# Patient Record
Sex: Female | Born: 1980 | Race: Black or African American | Hispanic: No | Marital: Single | Smoking: Never smoker
Health system: Southern US, Community
[De-identification: ages and names within clinical notes are randomized; demographics above are authoritative.]

## PROBLEM LIST (undated history)

## (undated) DIAGNOSIS — I10 Essential (primary) hypertension: Secondary | ICD-10-CM

## (undated) DIAGNOSIS — H547 Unspecified visual loss: Secondary | ICD-10-CM

## (undated) HISTORY — DX: Essential (primary) hypertension: I10

## (undated) HISTORY — DX: Unspecified visual loss: H54.7

## (undated) HISTORY — PX: OTHER SURGICAL HISTORY: SHX169

## (undated) HISTORY — PX: WISDOM TOOTH EXTRACTION: SHX21

---

## 2012-07-18 DIAGNOSIS — H33009 Unspecified retinal detachment with retinal break, unspecified eye: Secondary | ICD-10-CM | POA: Insufficient documentation

## 2014-04-30 DIAGNOSIS — Q150 Congenital glaucoma: Secondary | ICD-10-CM | POA: Insufficient documentation

## 2015-07-08 DIAGNOSIS — H5411 Blindness, right eye, low vision left eye: Secondary | ICD-10-CM | POA: Diagnosis not present

## 2015-07-08 DIAGNOSIS — Q150 Congenital glaucoma: Secondary | ICD-10-CM | POA: Diagnosis not present

## 2015-07-08 DIAGNOSIS — H35023 Exudative retinopathy, bilateral: Secondary | ICD-10-CM | POA: Diagnosis not present

## 2015-07-08 DIAGNOSIS — H542 Low vision, both eyes: Secondary | ICD-10-CM | POA: Diagnosis not present

## 2015-07-08 DIAGNOSIS — H35053 Retinal neovascularization, unspecified, bilateral: Secondary | ICD-10-CM | POA: Diagnosis not present

## 2015-07-08 DIAGNOSIS — H35372 Puckering of macula, left eye: Secondary | ICD-10-CM | POA: Diagnosis not present

## 2015-10-06 DIAGNOSIS — Z23 Encounter for immunization: Secondary | ICD-10-CM | POA: Diagnosis not present

## 2015-11-05 DIAGNOSIS — E669 Obesity, unspecified: Secondary | ICD-10-CM | POA: Diagnosis not present

## 2015-11-05 DIAGNOSIS — Z0000 Encounter for general adult medical examination without abnormal findings: Secondary | ICD-10-CM | POA: Diagnosis not present

## 2015-11-05 DIAGNOSIS — Z6830 Body mass index (BMI) 30.0-30.9, adult: Secondary | ICD-10-CM | POA: Diagnosis not present

## 2015-11-05 DIAGNOSIS — E559 Vitamin D deficiency, unspecified: Secondary | ICD-10-CM | POA: Diagnosis not present

## 2016-04-13 DIAGNOSIS — Q150 Congenital glaucoma: Secondary | ICD-10-CM | POA: Diagnosis not present

## 2016-04-13 DIAGNOSIS — H35053 Retinal neovascularization, unspecified, bilateral: Secondary | ICD-10-CM | POA: Diagnosis not present

## 2016-04-13 DIAGNOSIS — H35023 Exudative retinopathy, bilateral: Secondary | ICD-10-CM | POA: Diagnosis not present

## 2016-04-13 DIAGNOSIS — H35372 Puckering of macula, left eye: Secondary | ICD-10-CM | POA: Diagnosis not present

## 2016-10-12 DIAGNOSIS — H35053 Retinal neovascularization, unspecified, bilateral: Secondary | ICD-10-CM | POA: Diagnosis not present

## 2016-10-12 DIAGNOSIS — H35372 Puckering of macula, left eye: Secondary | ICD-10-CM | POA: Diagnosis not present

## 2016-10-12 DIAGNOSIS — H35023 Exudative retinopathy, bilateral: Secondary | ICD-10-CM | POA: Diagnosis not present

## 2016-10-12 DIAGNOSIS — Q150 Congenital glaucoma: Secondary | ICD-10-CM | POA: Diagnosis not present

## 2016-10-13 DIAGNOSIS — Z23 Encounter for immunization: Secondary | ICD-10-CM | POA: Diagnosis not present

## 2016-11-10 DIAGNOSIS — Z0000 Encounter for general adult medical examination without abnormal findings: Secondary | ICD-10-CM | POA: Diagnosis not present

## 2016-11-10 DIAGNOSIS — K5900 Constipation, unspecified: Secondary | ICD-10-CM | POA: Diagnosis not present

## 2016-11-10 DIAGNOSIS — Z3041 Encounter for surveillance of contraceptive pills: Secondary | ICD-10-CM | POA: Diagnosis not present

## 2016-11-10 DIAGNOSIS — E559 Vitamin D deficiency, unspecified: Secondary | ICD-10-CM | POA: Diagnosis not present

## 2016-11-10 DIAGNOSIS — Z113 Encounter for screening for infections with a predominantly sexual mode of transmission: Secondary | ICD-10-CM | POA: Diagnosis not present

## 2016-11-10 DIAGNOSIS — Z124 Encounter for screening for malignant neoplasm of cervix: Secondary | ICD-10-CM | POA: Diagnosis not present

## 2016-11-10 DIAGNOSIS — R5383 Other fatigue: Secondary | ICD-10-CM | POA: Diagnosis not present

## 2017-02-02 DIAGNOSIS — R030 Elevated blood-pressure reading, without diagnosis of hypertension: Secondary | ICD-10-CM | POA: Diagnosis not present

## 2017-02-02 DIAGNOSIS — Z8249 Family history of ischemic heart disease and other diseases of the circulatory system: Secondary | ICD-10-CM | POA: Diagnosis not present

## 2017-03-14 DIAGNOSIS — R030 Elevated blood-pressure reading, without diagnosis of hypertension: Secondary | ICD-10-CM | POA: Diagnosis not present

## 2017-04-12 DIAGNOSIS — H35053 Retinal neovascularization, unspecified, bilateral: Secondary | ICD-10-CM | POA: Diagnosis not present

## 2017-04-12 DIAGNOSIS — H35372 Puckering of macula, left eye: Secondary | ICD-10-CM | POA: Diagnosis not present

## 2017-04-12 DIAGNOSIS — H35023 Exudative retinopathy, bilateral: Secondary | ICD-10-CM | POA: Diagnosis not present

## 2017-04-25 DIAGNOSIS — R030 Elevated blood-pressure reading, without diagnosis of hypertension: Secondary | ICD-10-CM | POA: Diagnosis not present

## 2017-10-11 DIAGNOSIS — H35372 Puckering of macula, left eye: Secondary | ICD-10-CM | POA: Diagnosis not present

## 2017-10-11 DIAGNOSIS — H35053 Retinal neovascularization, unspecified, bilateral: Secondary | ICD-10-CM | POA: Diagnosis not present

## 2017-10-11 DIAGNOSIS — H35023 Exudative retinopathy, bilateral: Secondary | ICD-10-CM | POA: Diagnosis not present

## 2017-11-08 DIAGNOSIS — Z23 Encounter for immunization: Secondary | ICD-10-CM | POA: Diagnosis not present

## 2017-11-18 ENCOUNTER — Ambulatory Visit (INDEPENDENT_AMBULATORY_CARE_PROVIDER_SITE_OTHER): Payer: Medicare Other | Admitting: Nurse Practitioner

## 2017-11-18 ENCOUNTER — Encounter: Payer: Self-pay | Admitting: Nurse Practitioner

## 2017-11-18 VITALS — BP 120/84 | HR 87 | Temp 98.1°F | Ht 62.0 in | Wt 165.0 lb

## 2017-11-18 DIAGNOSIS — N6312 Unspecified lump in the right breast, upper inner quadrant: Secondary | ICD-10-CM | POA: Diagnosis not present

## 2017-11-18 DIAGNOSIS — Z0000 Encounter for general adult medical examination without abnormal findings: Secondary | ICD-10-CM | POA: Diagnosis not present

## 2017-11-18 DIAGNOSIS — I10 Essential (primary) hypertension: Secondary | ICD-10-CM

## 2017-11-18 DIAGNOSIS — Z3041 Encounter for surveillance of contraceptive pills: Secondary | ICD-10-CM | POA: Diagnosis not present

## 2017-11-18 DIAGNOSIS — Z0001 Encounter for general adult medical examination with abnormal findings: Secondary | ICD-10-CM | POA: Diagnosis not present

## 2017-11-18 LAB — POCT URINALYSIS DIPSTICK
BILIRUBIN UA: NEGATIVE
Glucose, UA: NEGATIVE
Leukocytes, UA: NEGATIVE
NITRITE UA: NEGATIVE
PH UA: 6.5 (ref 5.0–8.0)
PROTEIN UA: NEGATIVE
SPEC GRAV UA: 1.025 (ref 1.010–1.025)
UROBILINOGEN UA: 1 U/dL

## 2017-11-18 LAB — POCT UA - MICROALBUMIN
Albumin/Creatinine Ratio, Urine, POC: 30
Creatinine, POC: 300 mg/dL
Microalbumin Ur, POC: 30 mg/L

## 2017-11-18 NOTE — Progress Notes (Signed)
Subjective:     Patient ID: Yesenia Velasquez , female    DOB: 1980-05-06 , 37 y.o.   MRN: [id number]   Chief Complaint  Patient presents with  . Annual Exam    HPI  Hypertension  This is a chronic problem. The current episode started more than 1 year ago. The problem has been gradually improving since onset. The problem is controlled. Pertinent negatives include no anxiety. There are no associated agents to hypertension. Past treatments include ACE inhibitors. The current treatment provides moderate improvement. There are no compliance problems.  There is no history of CAD/MI or CVA. There is no history of chronic renal disease.      The patient states she uses OCP (estrogen/progesterone) for birth control. Last LMP 11/17/17 was No LMP recorded.. Negative for Dysmenorrhea and Negative for Menorrhagia. Negative for: breast discharge, breast lump(s), breast pain and breast self exam. Associated symptoms include abnormal vaginal bleeding. Pertinent negatives include abnormal bleeding (hematology), anxiety, decreased libido, depression, difficulty falling sleep, dyspareunia, history of infertility, nocturia, sexual dysfunction, sleep disturbances, urinary incontinence, urinary urgency, vaginal discharge and vaginal itching. Diet regular, tries to watch salt intake.The patient states her exercise level is  occasionally    . The patient's tobacco use is:  Social History   Tobacco Use  Smoking Status Never Smoker  Smokeless Tobacco Never Used  . She has been exposed to passive smoke. The patient's alcohol use is:  Social History   Substance and Sexual Activity  Alcohol Use Yes   Comment: SOCIALLY  . Additional information: Last pap 10/2016, next one scheduled for 10/ 2021.   No past medical history on file.    Current Outpatient Medications:  .  hydrochlorothiazide (HYDRODIURIL) 25 MG tablet, Take 25 mg by mouth daily., Disp: , Rfl:  .  norethindrone-ethinyl estradiol-iron (MICROGESTIN  FE,GILDESS FE,LOESTRIN FE) 1.5-30 MG-MCG tablet, Take 1 tablet by mouth daily., Disp: , Rfl:    No Known Allergies   Review of Systems  Constitutional: Negative.   HENT: Negative.   Eyes: Negative.        Legally blind   Respiratory: Negative.   Cardiovascular: Negative.   Gastrointestinal: Negative.   Endocrine: Negative.   Genitourinary: Negative.   Musculoskeletal: Negative.   Skin: Negative.   Allergic/Immunologic: Negative.   Neurological: Negative.   Hematological: Negative.   Psychiatric/Behavioral: Negative.      Today's Vitals   11/18/17 1452  BP: 120/84  Pulse: 87  Temp: 98.1 F (36.7 C)  TempSrc: Oral  SpO2: 98%  Weight: 165 lb (74.8 kg)  Height: 5\' 2"  (1.575 m)  PainSc: 8   PainLoc: Back   Body mass index is 30.18 kg/m.   Objective:  Physical Exam  Constitutional: She is oriented to person, place, and time. She appears well-developed and well-nourished.  HENT:  Head: Normocephalic.  Right Ear: External ear normal.  Left Ear: External ear normal.  Nose: Nose normal.  Mouth/Throat: Oropharynx is clear and moist.  Bilateral cerumen excess, not impacted  Eyes: Pupils are equal, round, and reactive to light. Conjunctivae and EOM are normal.  Neck: Normal range of motion. Neck supple.  Cardiovascular: Normal rate, regular rhythm and normal heart sounds.  Pulmonary/Chest: Effort normal and breath sounds normal. Right breast exhibits mass. There is breast discharge. No breast tenderness. Breasts are symmetrical.  Palpable lump to right upper inner breast at 2 o'clock.    Abdominal: Soft. Bowel sounds are normal.  Musculoskeletal: Normal range of motion.  Neurological: She  is alert and oriented to person, place, and time.  Skin: Skin is warm and dry. Capillary refill takes less than 2 seconds.  Psychiatric: She has a normal mood and affect.        Assessment And Plan:     1. Health maintenance examination  Behavior modifications discussed and diet  history reviewed.    Pt will continue to exercise regularly and modify diet with low GI, plant based foods and decrease intake of processed foods.   Recommend intake of daily multivitamin, Vitamin D.   All immunizations that include influenza, TDAP are up to date - CBC - CMP14 + Anion Gap  2. Essential hypertension  Chronic, controlled  Continue with current medications - POCT Urinalysis Dipstick (81002) - POCT UA - Microalbumin - EKG 12-Lead - CBC - CMP14 + Anion Gap  3. Lump in upper inner quadrant of right breast  Palpable nodule to left upper inner breast at 2 o'clock.  Will obtain diagnostic mammogram  Negative pregnancy test in office - MM Digital Diagnostic Unilat R; Future         Minette Brine, FNP

## 2017-11-19 LAB — CMP14 + ANION GAP
ALK PHOS: 61 IU/L (ref 39–117)
ALT: 17 IU/L (ref 0–32)
ANION GAP: 18 mmol/L (ref 10.0–18.0)
AST: 18 IU/L (ref 0–40)
Albumin/Globulin Ratio: 1.4 (ref 1.2–2.2)
Albumin: 4.3 g/dL (ref 3.5–5.5)
BUN/Creatinine Ratio: 14 (ref 9–23)
BUN: 13 mg/dL (ref 6–20)
Bilirubin Total: 0.2 mg/dL (ref 0.0–1.2)
CO2: 26 mmol/L (ref 20–29)
CREATININE: 0.95 mg/dL (ref 0.57–1.00)
Calcium: 9.6 mg/dL (ref 8.7–10.2)
Chloride: 98 mmol/L (ref 96–106)
GFR calc Af Amer: 88 mL/min/{1.73_m2} (ref >59)
GFR calc non Af Amer: 77 mL/min/{1.73_m2} (ref >59)
GLUCOSE: 87 mg/dL (ref 65–99)
Globulin, Total: 3 g/dL (ref 1.5–4.5)
Potassium: 3.5 mmol/L (ref 3.5–5.2)
Sodium: 142 mmol/L (ref 134–144)
Total Protein: 7.3 g/dL (ref 6.0–8.5)

## 2017-11-19 LAB — CBC
Hematocrit: 40 % (ref 34.0–46.6)
Hemoglobin: 13.7 g/dL (ref 11.1–15.9)
MCH: 29.7 pg (ref 26.6–33.0)
MCHC: 34.3 g/dL (ref 31.5–35.7)
MCV: 87 fL (ref 79–97)
PLATELETS: 267 10*3/uL (ref 150–450)
RBC: 4.61 x10E6/uL (ref 3.77–5.28)
RDW: 12.9 % (ref 12.3–15.4)
WBC: 6.8 10*3/uL (ref 3.4–10.8)

## 2017-11-29 ENCOUNTER — Other Ambulatory Visit: Payer: Self-pay | Admitting: Nurse Practitioner

## 2017-11-29 DIAGNOSIS — N6312 Unspecified lump in the right breast, upper inner quadrant: Secondary | ICD-10-CM

## 2017-12-08 ENCOUNTER — Ambulatory Visit
Admission: RE | Admit: 2017-12-08 | Discharge: 2017-12-08 | Disposition: A | Discharge status: Home / Self Care | Payer: Medicare Other | Source: Ambulatory Visit | Attending: Nurse Practitioner | Admitting: Nurse Practitioner

## 2017-12-08 DIAGNOSIS — R922 Inconclusive mammogram: Secondary | ICD-10-CM | POA: Diagnosis not present

## 2017-12-08 DIAGNOSIS — N6312 Unspecified lump in the right breast, upper inner quadrant: Secondary | ICD-10-CM

## 2017-12-08 DIAGNOSIS — N6489 Other specified disorders of breast: Secondary | ICD-10-CM | POA: Diagnosis not present

## 2018-03-26 ENCOUNTER — Other Ambulatory Visit: Payer: Self-pay | Admitting: Nurse Practitioner

## 2018-04-06 ENCOUNTER — Other Ambulatory Visit: Payer: Self-pay

## 2018-04-06 ENCOUNTER — Ambulatory Visit (INDEPENDENT_AMBULATORY_CARE_PROVIDER_SITE_OTHER): Payer: Medicare Other

## 2018-04-06 ENCOUNTER — Ambulatory Visit (INDEPENDENT_AMBULATORY_CARE_PROVIDER_SITE_OTHER): Payer: Medicare Other | Admitting: Nurse Practitioner

## 2018-04-06 ENCOUNTER — Encounter: Payer: Self-pay | Admitting: Nurse Practitioner

## 2018-04-06 VITALS — BP 124/82 | HR 73 | Temp 98.3°F | Ht 62.0 in | Wt 164.0 lb

## 2018-04-06 VITALS — BP 124/82 | HR 73 | Ht 62.0 in | Wt 164.0 lb

## 2018-04-06 DIAGNOSIS — I10 Essential (primary) hypertension: Secondary | ICD-10-CM

## 2018-04-06 DIAGNOSIS — H548 Legal blindness, as defined in USA: Secondary | ICD-10-CM

## 2018-04-06 DIAGNOSIS — Z0000 Encounter for general adult medical examination without abnormal findings: Secondary | ICD-10-CM

## 2018-04-06 NOTE — Patient Instructions (Signed)
Ms. Yesenia Velasquez , Thank you for taking time to come for your Medicare Wellness Visit. I appreciate your ongoing commitment to your health goals. Please review the following plan we discussed and let me know if I can assist you in the future.   Screening recommendations/referrals: Colonoscopy: n/a Mammogram: 11/2017 Bone Density: n/a Recommended yearly ophthalmology/optometry visit for glaucoma screening and checkup Recommended yearly dental visit for hygiene and checkup  Vaccinations: Influenza vaccine: 10/2017 Pneumococcal vaccine: n/a Tdap vaccine: 03/2013 Shingles vaccine: n/a    Advanced directives: Advance directive discussed with you today. I have provided a copy for you to complete at home and have notarized. Once this is complete please bring a copy in to our office so we can scan it into your chart.   Conditions/risks identified: overweight  Next appointment: 11/24/2018 at 230  Preventive Care 40-64 Years, Female Preventive care refers to lifestyle choices and visits with your health care provider that can promote health and wellness. What does preventive care include?  A yearly physical exam. This is also called an annual well check.  Dental exams once or twice a year.  Routine eye exams. Ask your health care provider how often you should have your eyes checked.  Personal lifestyle choices, including:  Daily care of your teeth and gums.  Regular physical activity.  Eating a healthy diet.  Avoiding tobacco and drug use.  Limiting alcohol use.  Practicing safe sex.  Taking low-dose aspirin daily starting at age 80.  Taking vitamin and mineral supplements as recommended by your health care provider. What happens during an annual well check? The services and screenings done by your health care provider during your annual well check will depend on your age, overall health, lifestyle risk factors, and family history of disease. Counseling  Your health care provider  may ask you questions about your:  Alcohol use.  Tobacco use.  Drug use.  Emotional well-being.  Home and relationship well-being.  Sexual activity.  Eating habits.  Work and work Statistician.  Method of birth control.  Menstrual cycle.  Pregnancy history. Screening  You may have the following tests or measurements:  Height, weight, and BMI.  Blood pressure.  Lipid and cholesterol levels. These may be checked every 5 years, or more frequently if you are over 44 years old.  Skin check.  Lung cancer screening. You may have this screening every year starting at age 45 if you have a 30-pack-year history of smoking and currently smoke or have quit within the past 15 years.  Fecal occult blood test (FOBT) of the stool. You may have this test every year starting at age 8.  Flexible sigmoidoscopy or colonoscopy. You may have a sigmoidoscopy every 5 years or a colonoscopy every 10 years starting at age 11.  Hepatitis C blood test.  Hepatitis B blood test.  Sexually transmitted disease (STD) testing.  Diabetes screening. This is done by checking your blood sugar (glucose) after you have not eaten for a while (fasting). You may have this done every 1-3 years.  Mammogram. This may be done every 1-2 years. Talk to your health care provider about when you should start having regular mammograms. This may depend on whether you have a family history of breast cancer.  BRCA-related cancer screening. This may be done if you have a family history of breast, ovarian, tubal, or peritoneal cancers.  Pelvic exam and Pap test. This may be done every 3 years starting at age 30. Starting at age 35, this  may be done every 5 years if you have a Pap test in combination with an HPV test.  Bone density scan. This is done to screen for osteoporosis. You may have this scan if you are at high risk for osteoporosis. Discuss your test results, treatment options, and if necessary, the need for more  tests with your health care provider. Vaccines  Your health care provider may recommend certain vaccines, such as:  Influenza vaccine. This is recommended every year.  Tetanus, diphtheria, and acellular pertussis (Tdap, Td) vaccine. You may need a Td booster every 10 years.  Zoster vaccine. You may need this after age 64.  Pneumococcal 13-valent conjugate (PCV13) vaccine. You may need this if you have certain conditions and were not previously vaccinated.  Pneumococcal polysaccharide (PPSV23) vaccine. You may need one or two doses if you smoke cigarettes or if you have certain conditions. Talk to your health care provider about which screenings and vaccines you need and how often you need them. This information is not intended to replace advice given to you by your health care provider. Make sure you discuss any questions you have with your health care provider. Document Released: 02/07/2015 Document Revised: 10/01/2015 Document Reviewed: 11/12/2014 Elsevier Interactive Patient Education  2017 [place] Prevention in the Home Falls can cause injuries. They can happen to people of all ages. There are many things you can do to make your home safe and to help prevent falls. What can I do on the outside of my home?  Regularly fix the edges of walkways and driveways and fix any cracks.  Remove anything that might make you trip as you walk through a door, such as a raised step or threshold.  Trim any bushes or trees on the path to your home.  Use bright outdoor lighting.  Clear any walking paths of anything that might make someone trip, such as rocks or tools.  Regularly check to see if handrails are loose or broken. Make sure that both sides of any steps have handrails.  Any raised decks and porches should have guardrails on the edges.  Have any leaves, snow, or ice cleared regularly.  Use sand or salt on walking paths during winter.  Clean up any spills in your  garage right away. This includes oil or grease spills. What can I do in the bathroom?  Use night lights.  Install grab bars by the toilet and in the tub and shower. Do not use towel bars as grab bars.  Use non-skid mats or decals in the tub or shower.  If you need to sit down in the shower, use a plastic, non-slip stool.  Keep the floor dry. Clean up any water that spills on the floor as soon as it happens.  Remove soap buildup in the tub or shower regularly.  Attach bath mats securely with double-sided non-slip rug tape.  Do not have throw rugs and other things on the floor that can make you trip. What can I do in the bedroom?  Use night lights.  Make sure that you have a light by your bed that is easy to reach.  Do not use any sheets or blankets that are too big for your bed. They should not hang down onto the floor.  Have a firm chair that has side arms. You can use this for support while you get dressed.  Do not have throw rugs and other things on the floor that can make you  trip. What can I do in the kitchen?  Clean up any spills right away.  Avoid walking on wet floors.  Keep items that you use a lot in easy-to-reach places.  If you need to reach something above you, use a strong step stool that has a grab bar.  Keep electrical cords out of the way.  Do not use floor polish or wax that makes floors slippery. If you must use wax, use non-skid floor wax.  Do not have throw rugs and other things on the floor that can make you trip. What can I do with my stairs?  Do not leave any items on the stairs.  Make sure that there are handrails on both sides of the stairs and use them. Fix handrails that are broken or loose. Make sure that handrails are as long as the stairways.  Check any carpeting to make sure that it is firmly attached to the stairs. Fix any carpet that is loose or worn.  Avoid having throw rugs at the top or bottom of the stairs. If you do have throw  rugs, attach them to the floor with carpet tape.  Make sure that you have a light switch at the top of the stairs and the bottom of the stairs. If you do not have them, ask someone to add them for you. What else can I do to help prevent falls?  Wear shoes that:  Do not have high heels.  Have rubber bottoms.  Are comfortable and fit you well.  Are closed at the toe. Do not wear sandals.  If you use a stepladder:  Make sure that it is fully opened. Do not climb a closed stepladder.  Make sure that both sides of the stepladder are locked into place.  Ask someone to hold it for you, if possible.  Clearly mark and make sure that you can see:  Any grab bars or handrails.  First and last steps.  Where the edge of each step is.  Use tools that help you move around (mobility aids) if they are needed. These include:  Canes.  Walkers.  Scooters.  Crutches.  Turn on the lights when you go into a dark area. Replace any light bulbs as soon as they burn out.  Set up your furniture so you have a clear path. Avoid moving your furniture around.  If any of your floors are uneven, fix them.  If there are any pets around you, be aware of where they are.  Review your medicines with your doctor. Some medicines can make you feel dizzy. This can increase your chance of falling. Ask your doctor what other things that you can do to help prevent falls. This information is not intended to replace advice given to you by your health care provider. Make sure you discuss any questions you have with your health care provider. Document Released: 11/07/2008 Document Revised: 06/19/2015 Document Reviewed: 02/15/2014 Elsevier Interactive Patient Education  2017 [employer].

## 2018-04-06 NOTE — Progress Notes (Signed)
Subjective:   Yesenia Velasquez is a 38 y.o. female who presents for an Initial Medicare Annual Wellness Visit.  Review of Systems    n/a  Cardiac Risk Factors include: hypertension     Objective:    Today's Vitals   04/06/18 1537  BP: 124/82  Pulse: 73  Temp: 98.3 F (36.8 C)  TempSrc: Oral  SpO2: 98%  Weight: 164 lb (74.4 kg)  Height: 5\' 2"  (1.575 m)   Body mass index is 30 kg/m.  Advanced Directives 04/06/2018  Does Patient Have a Medical Advance Directive? No  Would patient like information on creating a medical advance directive? Yes (MAU/Ambulatory/Procedural Areas - Information given)    Current Medications (verified) Outpatient Encounter Medications as of 04/06/2018  Medication Sig  . hydrochlorothiazide (HYDRODIURIL) 25 MG tablet TAKE 1 TABLET BY MOUTH EVERY DAY  . norethindrone-ethinyl estradiol-iron (MICROGESTIN FE,GILDESS FE,LOESTRIN FE) 1.5-30 MG-MCG tablet Take 1 tablet by mouth daily.   No facility-administered encounter medications on file as of 04/06/2018.     Allergies (verified) Patient has no known allergies.   History: Past Medical History:  Diagnosis Date  . Hypertension    History reviewed. No pertinent surgical history. History reviewed. No pertinent family history. Social History   Socioeconomic History  . Marital status: Single    Spouse name: Not on file  . Number of children: Not on file  . Years of education: Not on file  . Highest education level: Not on file  Occupational History  . Not on file  Social Needs  . Financial resource strain: Not hard at all  . Food insecurity:    Worry: Never true    Inability: Never true  . Transportation needs:    Medical: No    Non-medical: No  Tobacco Use  . Smoking status: Never Smoker  . Smokeless tobacco: Never Used  Substance and Sexual Activity  . Alcohol use: Yes    Comment: SOCIALLY  . Drug use: Not Currently  . Sexual activity: Yes  Lifestyle  . Physical activity:   Days per week: 7 days    Minutes per session: 30 min  . Stress: Not at all  Relationships  . Social connections:    Talks on phone: Not on file    Gets together: Not on file    Attends religious service: Not on file    Active member of club or organization: Not on file    Attends meetings of clubs or organizations: Not on file    Relationship status: Not on file  Other Topics Concern  . Not on file  Social History Narrative  . Not on file    Tobacco Counseling Counseling given: Not Answered   Clinical Intake:  Pre-visit preparation completed: Yes  Pain : No/denies pain     Nutritional Status: BMI > 30  Obese Nutritional Risks: None Diabetes: No  How often do you need to have someone help you when you read instructions, pamphlets, or other written materials from your doctor or pharmacy?: 1 - Never What is the last grade level you completed in school?: 12TH GRADE     Information entered by :: NAllen LPN   Activities of Daily Living In your present state of health, do you have any difficulty performing the following activities: 04/06/2018  Hearing? N  Vision? Y  Comment blurry vision  Difficulty concentrating or making decisions? N  Walking or climbing stairs? N  Dressing or bathing? N  Doing errands, shopping? N  Preparing Food and eating ? N  Using the Toilet? N  In the past six months, have you accidently leaked urine? N  Do you have problems with loss of bowel control? N  Managing your Medications? N  Managing your Finances? N  Housekeeping or managing your Housekeeping? N     Immunizations and Health Maintenance Immunization History  Administered Date(s) Administered  . Influenza-Unspecified 11/07/2017   There are no preventive care reminders to display for this patient.  Patient Care Team: Minette Brine, FNP as PCP - General (General Practice)  Indicate any recent Medical Services you may have received from other than [facility] providers in the past  year (date may be approximate).     Assessment:   This is a routine wellness examination for Yesenia Velasquez.  Hearing/Vision screen Vision Screening Comments: [facility] Annual eye exams  Dietary issues and exercise activities discussed: Current Exercise Habits: The patient has a physically strenuous job, but has no regular exercise apart from work., Type of exercise: walking, Time (Minutes): 30, Frequency (Times/Week): 7, Weekly Exercise (Minutes/Week): 210, Intensity: Moderate  Goals    . Exercise 150 min/wk Moderate Activity (pt-stated)     Wants to exercise more to lose stomach      Depression Screen PHQ 2/9 Scores 04/06/2018 11/18/2017  PHQ - 2 Score 0 0  PHQ- 9 Score 0 -    Fall Risk Fall Risk  04/06/2018 11/18/2017  Falls in the past year? 0 No  Risk for fall due to : Medication side effect -  Follow up Education provided;Falls prevention discussed -    Is the patient's home free of loose throw rugs in walkways, pet beds, electrical cords, etc?   yes      Grab bars in the bathroom? no      Handrails on the stairs?   yes      Adequate lighting?   yes  Timed Get Up and Go Performed n/a  Cognitive Function:     6CIT Screen 04/06/2018  What Year? 0 points  What month? 0 points  What time? 0 points  Count back from 20 0 points  Months in reverse 0 points  Repeat phrase 0 points  Total Score 0    Screening Tests Health Maintenance  Topic Date Due  . PAP SMEAR-Modifier  11/14/2019  . TETANUS/TDAP  11/14/2023  . INFLUENZA VACCINE  Completed  . HIV Screening  Completed    Qualifies for Shingles Vaccine? no  Cancer Screenings: Lung: Low Dose CT Chest recommended if Age 61-80 years, 30 pack-year currently smoking OR have quit w/in 15years. Patient does not qualify. Breast: Up to date on Mammogram? Yes   Up to date of Bone Density/Dexa? n/a Colorectal: n/a  Additional Screenings:  Hepatitis C Screening: n/a     Plan:    Wants to exercise more to lose  stomach.  I have personally reviewed and noted the following in the patient's chart:   . Medical and social history . Use of alcohol, tobacco or illicit drugs  . Current medications and supplements . Functional ability and status . Nutritional status . Physical activity . Advanced directives . List of other physicians . Hospitalizations, surgeries, and ER visits in previous 12 months . Vitals . Screenings to include cognitive, depression, and falls . Referrals and appointments  In addition, I have reviewed and discussed with patient certain preventive protocols, quality metrics, and best practice recommendations. A written personalized care plan for preventive services as well as general  preventive health recommendations were provided to patient.     Kellie Simmering, LPN   [id number]

## 2018-04-06 NOTE — Progress Notes (Signed)
  Subjective:     Patient ID: Yesenia Velasquez , female    DOB: [birth year] , 38 y.o.   MRN: [id number]   Chief Complaint  Patient presents with  . Hypertension    follow up for htn     HPI  Hypertension  This is a new problem. The current episode started more than 1 year ago. The problem is unchanged. The problem is controlled. Pertinent negatives include no anxiety, chest pain, headaches or palpitations. There are no associated agents to hypertension. There are no known risk factors for coronary artery disease.     Past Medical History:  Diagnosis Date  . Hypertension      No family history on file.   Current Outpatient Medications:  .  hydrochlorothiazide (HYDRODIURIL) 25 MG tablet, TAKE 1 TABLET BY MOUTH EVERY DAY, Disp: 90 tablet, Rfl: 0 .  norethindrone-ethinyl estradiol-iron (MICROGESTIN FE,GILDESS FE,LOESTRIN FE) 1.5-30 MG-MCG tablet, Take 1 tablet by mouth daily., Disp: , Rfl:    No Known Allergies   Review of Systems  Constitutional: Negative for fatigue.  HENT: Negative for congestion.        Legally blind  Respiratory: Negative for cough.   Cardiovascular: Negative for chest pain, palpitations and leg swelling.  Gastrointestinal: Negative for diarrhea and nausea.  Endocrine: Negative for polydipsia, polyphagia and polyuria.  Neurological: Negative for dizziness and headaches.     Today's Vitals   04/06/18 1521  BP: 124/82  Pulse: 73  SpO2: 98%  Weight: 164 lb (74.4 kg)  Height: 5\' 2"  (1.575 m)   Body mass index is 30 kg/m.   Objective:  Physical Exam Vitals signs reviewed.  Constitutional:      Appearance: Normal appearance.  Cardiovascular:     Rate and Rhythm: Normal rate and regular rhythm.     Pulses: Normal pulses.     Heart sounds: Normal heart sounds. No murmur.  Pulmonary:     Effort: Pulmonary effort is normal. No respiratory distress.     Breath sounds: Normal breath sounds. No wheezing or rales.  Skin:    General: Skin is warm and  dry.     Capillary Refill: Capillary refill takes less than 2 seconds.  Neurological:     General: No focal deficit present.     Mental Status: She is alert and oriented to person, place, and time.  Psychiatric:        Mood and Affect: Mood normal.        Behavior: Behavior normal.        Thought Content: Thought content normal.        Judgment: Judgment normal.         Assessment And Plan:     1. Essential hypertension . B/P is controlled.  . The importance of regular exercise and dietary modification was stressed to the patient.    Minette Brine, FNP

## 2018-04-09 ENCOUNTER — Other Ambulatory Visit: Payer: Self-pay | Admitting: Nurse Practitioner

## 2018-04-18 ENCOUNTER — Other Ambulatory Visit: Payer: Self-pay | Admitting: Nurse Practitioner

## 2018-06-09 ENCOUNTER — Telehealth: Payer: Self-pay

## 2018-06-09 NOTE — Telephone Encounter (Signed)
Patient called stating she thinks she has a UTI and would like an appt. Patient stated she wanted one for the week of memorial day pt declined coming in sooner so I scheduled her for 05/26. YRL,RMA

## 2018-06-20 ENCOUNTER — Other Ambulatory Visit: Payer: Self-pay

## 2018-06-20 ENCOUNTER — Other Ambulatory Visit (HOSPITAL_COMMUNITY)
Admission: RE | Admit: 2018-06-20 | Discharge: 2018-06-20 | Disposition: A | Discharge status: Home / Self Care | Payer: BLUE CROSS/BLUE SHIELD | Source: Ambulatory Visit | Attending: Nurse Practitioner | Admitting: Nurse Practitioner

## 2018-06-20 ENCOUNTER — Ambulatory Visit (INDEPENDENT_AMBULATORY_CARE_PROVIDER_SITE_OTHER): Payer: Medicare Other | Admitting: Nurse Practitioner

## 2018-06-20 ENCOUNTER — Encounter: Payer: Self-pay | Admitting: Nurse Practitioner

## 2018-06-20 VITALS — BP 106/70 | HR 90 | Temp 98.5°F | Ht 62.6 in | Wt 162.8 lb

## 2018-06-20 DIAGNOSIS — N898 Other specified noninflammatory disorders of vagina: Secondary | ICD-10-CM

## 2018-06-20 LAB — POCT URINALYSIS DIPSTICK
Bilirubin, UA: NEGATIVE
Glucose, UA: NEGATIVE
Ketones, UA: NEGATIVE
Leukocytes, UA: NEGATIVE
Nitrite, UA: NEGATIVE
Protein, UA: NEGATIVE
Spec Grav, UA: >=1.03 — AB (ref 1.010–1.025)
Urobilinogen, UA: 1 E.U./dL
pH, UA: 6 (ref 5.0–8.0)

## 2018-06-20 MED ORDER — METRONIDAZOLE 500 MG PO TABS: 500.0000 mg | ORAL_TABLET | Freq: Two times a day (BID) | ORAL | 0 refills | Status: AC

## 2018-06-20 NOTE — Progress Notes (Signed)
  Subjective:     Patient ID: Yesenia Velasquez , female    DOB: 08/28/1980 , 38 y.o.   MRN: [id number]   Chief Complaint  Patient presents with  . VAGINAL ODOR    patient states her urine has a strange smell and she denies having a urge to go and denies having any pain    HPI  Vaginal odor for the last month.  Denies vaginal discharge.  Denies frequency in urination.   No changes in soaps or detergents.  Patient's last menstrual period was 06/16/2018.  She has been sexually active in the last 2 months.    Past Medical History:  Diagnosis Date  . Hypertension      No family history on file.   Current Outpatient Medications:  .  hydrochlorothiazide (HYDRODIURIL) 25 MG tablet, TAKE 1 TABLET BY MOUTH EVERY DAY, Disp: 90 tablet, Rfl: 0 .  norethindrone-ethinyl estradiol-iron (MICROGESTIN FE,GILDESS FE,LOESTRIN FE) 1.5-30 MG-MCG tablet, Take 1 tablet by mouth daily., Disp: , Rfl:    No Known Allergies   Review of Systems  Constitutional: Negative.   Respiratory: Negative.   Cardiovascular: Negative.  Negative for chest pain, palpitations and leg swelling.  Endocrine: Negative for polydipsia, polyphagia and polyuria.  Genitourinary: Negative for difficulty urinating, dysuria, flank pain, frequency, hematuria, pelvic pain, urgency, vaginal discharge and vaginal pain.     Today's Vitals   06/20/18 0942  BP: 106/70  Pulse: 90  Temp: 98.5 F (36.9 C)  TempSrc: Oral  Weight: 162 lb 12.8 oz (73.8 kg)  Height: 5' 2.6" (1.59 m)  PainSc: 0-No pain   Body mass index is 29.21 kg/m.   Objective:  Physical Exam Constitutional:      Appearance: Normal appearance.  Cardiovascular:     Rate and Rhythm: Normal rate and regular rhythm.     Pulses: Normal pulses.     Heart sounds: Normal heart sounds. No murmur.  Pulmonary:     Effort: Pulmonary effort is normal.     Breath sounds: Normal breath sounds.  Abdominal:     General: Bowel sounds are normal. There is no distension.   Palpations: Abdomen is soft.     Tenderness: There is no abdominal tenderness.  Skin:    General: Skin is warm and dry.     Capillary Refill: Capillary refill takes less than 2 seconds.  Neurological:     General: No focal deficit present.     Mental Status: She is alert and oriented to person, place, and time.  Psychiatric:        Mood and Affect: Mood normal.        Behavior: Behavior normal.        Thought Content: Thought content normal.        Judgment: Judgment normal.         Assessment And Plan:     1. Vaginal odor  Urinalysis has trace blood, she just ended her menstruation.  Will send Rx for metronidazole while awaiting cytology results.   - POCT Urinalysis Dipstick (81002)        Minette Brine, FNP    THE PATIENT IS ENCOURAGED TO PRACTICE SOCIAL DISTANCING DUE TO THE COVID-19 PANDEMIC.

## 2018-06-22 LAB — URINE CYTOLOGY ANCILLARY ONLY
Bacterial vaginitis: NEGATIVE
Candida vaginitis: NEGATIVE
Chlamydia: NEGATIVE
Neisseria Gonorrhea: NEGATIVE
Trichomonas: NEGATIVE

## 2018-07-01 DIAGNOSIS — Z03818 Encounter for observation for suspected exposure to other biological agents ruled out: Secondary | ICD-10-CM | POA: Diagnosis not present

## 2018-08-25 ENCOUNTER — Other Ambulatory Visit: Payer: Self-pay

## 2018-08-25 ENCOUNTER — Encounter: Payer: Self-pay | Admitting: Nurse Practitioner

## 2018-08-25 MED ORDER — HYDROCHLOROTHIAZIDE 25 MG PO TABS: 25.0000 mg | ORAL_TABLET | Freq: Every day | ORAL | 0 refills | Status: DC

## 2018-09-04 ENCOUNTER — Telehealth: Payer: Self-pay

## 2018-09-04 NOTE — Telephone Encounter (Signed)
Yesenia Velasquez from [facility] called stating pt has a regular heartbeat but she is asymptomatic she denies any chest pain or SOB. Pt bp was 120/86 and pulse was 68. Pt was advised to f/u with pcp. [phone]   I HAVE CALLED PT AND SCHEDULED HER AN APPOINTMENT SO WE CAN DO AN EKG. PT ALSO ADVISED TO TAKE ASPIRIN 81MG  DAILY. YRL,RMA

## 2018-09-11 ENCOUNTER — Ambulatory Visit (INDEPENDENT_AMBULATORY_CARE_PROVIDER_SITE_OTHER): Payer: Medicare Other | Admitting: Nurse Practitioner

## 2018-09-11 ENCOUNTER — Other Ambulatory Visit: Payer: Self-pay

## 2018-09-11 ENCOUNTER — Encounter: Payer: Self-pay | Admitting: Nurse Practitioner

## 2018-09-11 VITALS — BP 114/80 | HR 78 | Temp 98.4°F | Ht 62.2 in | Wt 163.2 lb

## 2018-09-11 DIAGNOSIS — I10 Essential (primary) hypertension: Secondary | ICD-10-CM | POA: Diagnosis not present

## 2018-09-11 DIAGNOSIS — I499 Cardiac arrhythmia, unspecified: Secondary | ICD-10-CM

## 2018-09-11 NOTE — Progress Notes (Addendum)
  Subjective:     Patient ID: Yesenia Velasquez , female    DOB: 1980-06-18 , 38 y.o.   MRN: [id number]   Chief Complaint  Patient presents with  . irregular heartbeat    HPI  She was seen by the [facility] NP who detected an abnormal heart beat. Denies any shortness of breath or chest pain. She is taking aspirin 81 mg daily. Her mother's father and siblings has a history of heart disease.   Wt Readings from Last 3 Encounters: 09/11/18 : 163 lb 3.2 oz (74 kg) 06/20/18 : 162 lb 12.8 oz (73.8 kg) 04/06/18 : 164 lb (74.4 kg)     Past Medical History:  Diagnosis Date  . Hypertension      No family history on file.   Current Outpatient Medications:  .  hydrochlorothiazide (HYDRODIURIL) 25 MG tablet, Take 1 tablet (25 mg total) by mouth daily., Disp: 90 tablet, Rfl: 0 .  norethindrone-ethinyl estradiol-iron (MICROGESTIN FE,GILDESS FE,LOESTRIN FE) 1.5-30 MG-MCG tablet, Take 1 tablet by mouth daily., Disp: , Rfl:    No Known Allergies   Review of Systems  Constitutional: Negative.   Respiratory: Negative.   Cardiovascular: Negative.  Negative for chest pain, palpitations and leg swelling.  Neurological: Negative for dizziness.  Psychiatric/Behavioral: Negative.      Today's Vitals   09/11/18 1503  BP: 114/80  Pulse: 78  Temp: 98.4 F (36.9 C)  TempSrc: Oral  Weight: 163 lb 3.2 oz (74 kg)  Height: 5' 2.2" (1.58 m)  PainSc: 0-No pain   Body mass index is 29.66 kg/m.   Objective:  Physical Exam Constitutional:      Appearance: Normal appearance.  Cardiovascular:     Rate and Rhythm: Normal rate and regular rhythm.     Pulses: Normal pulses.     Heart sounds: Normal heart sounds. No murmur.  Pulmonary:     Effort: Pulmonary effort is normal.  Skin:    General: Skin is warm and dry.     Capillary Refill: Capillary refill takes less than 2 seconds.  Neurological:     General: No focal deficit present.     Mental Status: She is alert and oriented to person, place, and  time.  Psychiatric:        Mood and Affect: Mood normal.        Behavior: Behavior normal.        Thought Content: Thought content normal.        Judgment: Judgment normal.         Assessment And Plan:    1. Essential hypertension  EKG normal sinus rhythm  Continue with the daily ASA 81 mg  EKG done with sinus rhythm - EKG 12-Lead  2. Irregular heart beat  No abnormal heart sounds on auscultation  Advised to avoid caffeine and to stay well hydrated with water.   Continue with ASA 81 mg daily   Minette Brine, FNP    THE PATIENT IS ENCOURAGED TO PRACTICE SOCIAL DISTANCING DUE TO THE COVID-19 PANDEMIC.

## 2018-10-10 DIAGNOSIS — H33002 Unspecified retinal detachment with retinal break, left eye: Secondary | ICD-10-CM | POA: Diagnosis not present

## 2018-10-10 DIAGNOSIS — H35053 Retinal neovascularization, unspecified, bilateral: Secondary | ICD-10-CM | POA: Diagnosis not present

## 2018-10-10 DIAGNOSIS — H35023 Exudative retinopathy, bilateral: Secondary | ICD-10-CM | POA: Diagnosis not present

## 2018-10-10 DIAGNOSIS — H35342 Macular cyst, hole, or pseudohole, left eye: Secondary | ICD-10-CM | POA: Diagnosis not present

## 2018-10-22 ENCOUNTER — Other Ambulatory Visit: Payer: Self-pay | Admitting: Nurse Practitioner

## 2018-10-23 ENCOUNTER — Other Ambulatory Visit: Payer: Self-pay

## 2018-10-23 NOTE — Telephone Encounter (Signed)
error 

## 2018-11-01 DIAGNOSIS — Z23 Encounter for immunization: Secondary | ICD-10-CM | POA: Diagnosis not present

## 2018-11-22 ENCOUNTER — Encounter: Payer: BLUE CROSS/BLUE SHIELD | Admitting: Nurse Practitioner

## 2018-11-24 ENCOUNTER — Encounter: Payer: BLUE CROSS/BLUE SHIELD | Admitting: Nurse Practitioner

## 2018-11-28 ENCOUNTER — Ambulatory Visit (INDEPENDENT_AMBULATORY_CARE_PROVIDER_SITE_OTHER): Payer: Medicare Other | Admitting: Nurse Practitioner

## 2018-11-28 ENCOUNTER — Encounter: Payer: Self-pay | Admitting: Nurse Practitioner

## 2018-11-28 ENCOUNTER — Other Ambulatory Visit: Payer: Self-pay

## 2018-11-28 VITALS — BP 120/80 | HR 97 | Temp 98.9°F | Ht 62.6 in | Wt 169.2 lb

## 2018-11-28 DIAGNOSIS — E559 Vitamin D deficiency, unspecified: Secondary | ICD-10-CM | POA: Diagnosis not present

## 2018-11-28 DIAGNOSIS — E6609 Other obesity due to excess calories: Secondary | ICD-10-CM | POA: Insufficient documentation

## 2018-11-28 DIAGNOSIS — Z3041 Encounter for surveillance of contraceptive pills: Secondary | ICD-10-CM

## 2018-11-28 DIAGNOSIS — Z6830 Body mass index (BMI) 30.0-30.9, adult: Secondary | ICD-10-CM

## 2018-11-28 DIAGNOSIS — I10 Essential (primary) hypertension: Secondary | ICD-10-CM | POA: Diagnosis not present

## 2018-11-28 DIAGNOSIS — Z0000 Encounter for general adult medical examination without abnormal findings: Secondary | ICD-10-CM | POA: Diagnosis not present

## 2018-11-28 LAB — POCT URINALYSIS DIPSTICK
Bilirubin, UA: NEGATIVE
Glucose, UA: NEGATIVE
Leukocytes, UA: NEGATIVE
Nitrite, UA: NEGATIVE
Protein, UA: NEGATIVE
Spec Grav, UA: 1.025 (ref 1.010–1.025)
Urobilinogen, UA: 1 E.U./dL
pH, UA: 7 (ref 5.0–8.0)

## 2018-11-28 LAB — POCT UA - MICROALBUMIN
Albumin/Creatinine Ratio, Urine, POC: 30
Creatinine, POC: 200 mg/dL
Microalbumin Ur, POC: 10 mg/L

## 2018-11-28 MED ORDER — NORETHINDRONE ACET-ETHINYL EST 1-20 MG-MCG PO TABS: 1.0000 | ORAL_TABLET | Freq: Every day | ORAL | 3 refills | Status: DC

## 2018-11-28 NOTE — Progress Notes (Signed)
Subjective:     Patient ID: Yesenia Velasquez , female    DOB: 05-May-1980 , 38 y.o.   MRN: [id number]   Chief Complaint  Patient presents with  . Annual Exam    HPI  Here for HM  Wt Readings from Last 3 Encounters: 11/28/18 : 169 lb 3.2 oz (76.7 kg) 09/11/18 : 163 lb 3.2 oz (74 kg) 06/20/18 : 162 lb 12.8 oz (73.8 kg)  She feels her weight is less than what we had today however she has just eaten   Hypertension This is a new problem. The current episode started more than 1 year ago. The problem is unchanged. The problem is controlled. Pertinent negatives include no anxiety, chest pain, headaches or palpitations. There are no associated agents to hypertension. There are no known risk factors for coronary artery disease.    The patient states she uses OCP (estrogen/progesterone) for birth control. Last LMP was Patient's last menstrual period was 11/20/2018.. Negative for Dysmenorrhea and Negative for Menorrhagia.  Negative for: breast discharge, breast lump(s), breast pain and breast self exam.  Pertinent negatives include abnormal bleeding (hematology), anxiety, decreased libido, depression, difficulty falling sleep, dyspareunia, history of infertility, nocturia, sexual dysfunction, sleep disturbances, urinary incontinence, urinary urgency, vaginal discharge and vaginal itching. Diet regular.The patient states her exercise level is    . The patient's tobacco use is:  Social History   Tobacco Use  Smoking Status Never Smoker  Smokeless Tobacco Never Used   She has been exposed to passive smoke. The patient's alcohol use is:  Social History   Substance and Sexual Activity  Alcohol Use Yes   Comment: SOCIALLY   Additional information: Last pap 2018 next one scheduled for 2021   Past Medical History:  Diagnosis Date  . Hypertension      No family history on file.   Current Outpatient Medications:  .  aspirin EC 81 MG tablet, Take 81 mg by mouth daily., Disp: , Rfl:  .   hydrochlorothiazide (HYDRODIURIL) 25 MG tablet, Take 1 tablet (25 mg total) by mouth daily., Disp: 90 tablet, Rfl: 0 .  MICROGESTIN 1-20 MG-MCG tablet, Take 1 tablet by mouth once daily, Disp: 21 tablet, Rfl: 2 .  norethindrone-ethinyl estradiol-iron (MICROGESTIN FE,GILDESS FE,LOESTRIN FE) 1.5-30 MG-MCG tablet, Take 1 tablet by mouth daily., Disp: , Rfl:    No Known Allergies   Review of Systems  Constitutional: Negative.   HENT: Negative.        Legally blind  Eyes: Negative.   Respiratory: Negative.   Cardiovascular: Negative.  Negative for chest pain and palpitations.  Gastrointestinal: Negative.   Endocrine: Negative.   Genitourinary: Negative.   Musculoskeletal: Negative.   Skin: Negative.   Allergic/Immunologic: Negative.   Neurological: Negative.  Negative for headaches.  Hematological: Negative.   Psychiatric/Behavioral: Negative.      Today's Vitals   11/28/18 1428  BP: 120/80  Pulse: 97  Temp: 98.9 F (37.2 C)  TempSrc: Oral  Weight: 169 lb 3.2 oz (76.7 kg)  Height: 5' 2.6" (1.59 m)  PainSc: 0-No pain   Body mass index is 30.36 kg/m.   Objective:  Physical Exam Vitals signs reviewed.  Constitutional:      Appearance: Normal appearance. She is well-developed.  HENT:     Head: Normocephalic and atraumatic.     Right Ear: Hearing, tympanic membrane, ear canal and external ear normal. There is no impacted cerumen.     Left Ear: Hearing, tympanic membrane, ear canal and external ear  normal. There is no impacted cerumen.  Eyes:     General: Lids are normal.     Extraocular Movements: Extraocular movements intact.     Conjunctiva/sclera: Conjunctivae normal.     Pupils: Pupils are equal, round, and reactive to light.     Funduscopic exam:    Right eye: No papilledema.        Left eye: No papilledema.  Neck:     Musculoskeletal: Full passive range of motion without pain, normal range of motion and neck supple.     Thyroid: No thyroid mass.     Vascular: No  carotid bruit.  Cardiovascular:     Rate and Rhythm: Normal rate and regular rhythm.     Pulses: Normal pulses.     Heart sounds: Normal heart sounds. No murmur.  Pulmonary:     Effort: Pulmonary effort is normal.     Breath sounds: Normal breath sounds.  Abdominal:     General: Abdomen is flat. Bowel sounds are normal.     Palpations: Abdomen is soft.  Musculoskeletal: Normal range of motion.        General: No swelling.     Right lower leg: No edema.     Left lower leg: No edema.  Skin:    General: Skin is warm and dry.     Capillary Refill: Capillary refill takes less than 2 seconds.  Neurological:     General: No focal deficit present.     Mental Status: She is alert and oriented to person, place, and time.     Cranial Nerves: No cranial nerve deficit.     Sensory: No sensory deficit.  Psychiatric:        Mood and Affect: Mood normal.        Behavior: Behavior normal.        Thought Content: Thought content normal.        Judgment: Judgment normal.         Assessment And Plan:     1. Health maintenance examination . Behavior modifications discussed and diet history reviewed.  . Pt will continue to exercise regularly and modify diet with low GI, plant based foods and decrease intake of processed foods.  . Recommend intake of daily multivitamin, Vitamin D, and calcium.  . Recommend mammogram (baseline done in 2019) for preventive screenings, as well as recommend immunizations that include influenza, TDAP (up to date) - CMP14 + Anion Gap - CBC no Diff  2. Encounter for surveillance of contraceptive pills  LMP - 11/20/2018 - norethindrone-ethinyl estradiol (MICROGESTIN) 1-20 MG-MCG tablet; Take 1 tablet by mouth daily.  Dispense: 63 tablet; Refill: 3  3. Essential hypertension . B/P is controlled.  . CMP ordered to check renal function.  . The importance of regular exercise and dietary modification was stressed to the patient.  . Stressed importance of losing ten  percent of her body weight to help with B/P control.  . The weight loss would help with decreasing cardiac and cancer risk as well.   - POCT Urinalysis Dipstick (81002) - POCT UA - Microalbumin - CMP14 + Anion Gap - CBC no Diff  4. Vitamin D deficiency  Will check vitamin D level and supplement as needed.     Also encouraged to spend 15 minutes in the sun daily.  - Vitamin D (25 hydroxy)  5. Class 1 obesity due to excess calories without serious comorbidity with body mass index (BMI) of 30.0 to 30.9 in adult  Chronic  Discussed healthy diet and regular exercise options   Encouraged to exercise at least 150 minutes per week with 2 days of strength training  Waist circumference 37.75 inches  Avoid caffeine and increase water intake.       Minette Brine, FNP    THE PATIENT IS ENCOURAGED TO PRACTICE SOCIAL DISTANCING DUE TO THE COVID-19 PANDEMIC.

## 2018-11-28 NOTE — Patient Instructions (Signed)
Health Maintenance  Topic Date Due  . PAP SMEAR-Modifier  11/14/2019  . TETANUS/TDAP  11/14/2023  . INFLUENZA VACCINE  Completed  . HIV Screening  Completed   Health Maintenance, Female Adopting a healthy lifestyle and getting preventive care are important in promoting health and wellness. Ask your health care provider about:  The right schedule for you to have regular tests and exams.  Things you can do on your own to prevent diseases and keep yourself healthy. What should I know about diet, weight, and exercise? Eat a healthy diet   Eat a diet that includes plenty of vegetables, fruits, low-fat dairy products, and lean protein.  Do not eat a lot of foods that are high in solid fats, added sugars, or sodium. Maintain a healthy weight Body mass index (BMI) is used to identify weight problems. It estimates body fat based on height and weight. Your health care provider can help determine your BMI and help you achieve or maintain a healthy weight. Get regular exercise Get regular exercise. This is one of the most important things you can do for your health. Most adults should:  Exercise for at least 150 minutes each week. The exercise should increase your heart rate and make you sweat (moderate-intensity exercise).  Do strengthening exercises at least twice a week. This is in addition to the moderate-intensity exercise.  Spend less time sitting. Even light physical activity can be beneficial. Watch cholesterol and blood lipids Have your blood tested for lipids and cholesterol at 38 years of age, then have this test every 5 years. Have your cholesterol levels checked more often if:  Your lipid or cholesterol levels are high.  You are older than 39 years of age.  You are at high risk for heart disease. What should I know about cancer screening? Depending on your health history and family history, you may need to have cancer screening at various ages. This may include screening for:   Breast cancer.  Cervical cancer.  Colorectal cancer.  Skin cancer.  Lung cancer. What should I know about heart disease, diabetes, and high blood pressure? Blood pressure and heart disease  High blood pressure causes heart disease and increases the risk of stroke. This is more likely to develop in people who have high blood pressure readings, are of African descent, or are overweight.  Have your blood pressure checked: ? Every 3-5 years if you are 33-43 years of age. ? Every year if you are 59 years old or older. Diabetes Have regular diabetes screenings. This checks your fasting blood sugar level. Have the screening done:  Once every three years after age 15 if you are at a normal weight and have a low risk for diabetes.  More often and at a younger age if you are overweight or have a high risk for diabetes. What should I know about preventing infection? Hepatitis B If you have a higher risk for hepatitis B, you should be screened for this virus. Talk with your health care provider to find out if you are at risk for hepatitis B infection. Hepatitis C Testing is recommended for:  Everyone born from [birth year] through 1965.  Anyone with known risk factors for hepatitis C. Sexually transmitted infections (STIs)  Get screened for STIs, including gonorrhea and chlamydia, if: ? You are sexually active and are younger than 38 years of age. ? You are older than 38 years of age and your health care provider tells you that you are at risk for  this type of infection. ? Your sexual activity has changed since you were last screened, and you are at increased risk for chlamydia or gonorrhea. Ask your health care provider if you are at risk.  Ask your health care provider about whether you are at high risk for HIV. Your health care provider may recommend a prescription medicine to help prevent HIV infection. If you choose to take medicine to prevent HIV, you should first get tested for HIV. You  should then be tested every 3 months for as long as you are taking the medicine. Pregnancy  If you are about to stop having your period (premenopausal) and you may become pregnant, seek counseling before you get pregnant.  Take 400 to 800 micrograms (mcg) of folic acid every day if you become pregnant.  Ask for birth control (contraception) if you want to prevent pregnancy. Osteoporosis and menopause Osteoporosis is a disease in which the bones lose minerals and strength with aging. This can result in bone fractures. If you are 34 years old or older, or if you are at risk for osteoporosis and fractures, ask your health care provider if you should:  Be screened for bone loss.  Take a calcium or vitamin D supplement to lower your risk of fractures.  Be given hormone replacement therapy (HRT) to treat symptoms of menopause. Follow these instructions at home: Lifestyle  Do not use any products that contain nicotine or tobacco, such as cigarettes, e-cigarettes, and chewing tobacco. If you need help quitting, ask your health care provider.  Do not use street drugs.  Do not share needles.  Ask your health care provider for help if you need support or information about quitting drugs. Alcohol use  Do not drink alcohol if: ? Your health care provider tells you not to drink. ? You are pregnant, may be pregnant, or are planning to become pregnant.  If you drink alcohol: ? Limit how much you use to 0-1 drink a day. ? Limit intake if you are breastfeeding.  Be aware of how much alcohol is in your drink. In the [place] one drink equals one 12 oz bottle of beer (355 mL), one 5 oz glass of wine (148 mL), or one 1 oz glass of hard liquor (44 mL). General instructions  Schedule regular health, dental, and eye exams.  Stay current with your vaccines.  Tell your health care provider if: ? You often feel depressed. ? You have ever been abused or do not feel safe at home. Summary  Adopting a  healthy lifestyle and getting preventive care are important in promoting health and wellness.  Follow your health care provider's instructions about healthy diet, exercising, and getting tested or screened for diseases.  Follow your health care provider's instructions on monitoring your cholesterol and blood pressure. This information is not intended to replace advice given to you by your health care provider. Make sure you discuss any questions you have with your health care provider. Document Released: 07/27/2010 Document Revised: 01/04/2018 Document Reviewed: 01/04/2018 Elsevier Patient Education  2020 [employer].

## 2018-11-29 LAB — CMP14 + ANION GAP
ALT: 25 IU/L (ref 0–32)
AST: 23 IU/L (ref 0–40)
Albumin/Globulin Ratio: 1.9 (ref 1.2–2.2)
Albumin: 4.9 g/dL — ABNORMAL HIGH (ref 3.8–4.8)
Alkaline Phosphatase: 87 IU/L (ref 39–117)
Anion Gap: 14 mmol/L (ref 10.0–18.0)
BUN/Creatinine Ratio: 11 (ref 9–23)
BUN: 10 mg/dL (ref 6–20)
Bilirubin Total: 0.2 mg/dL (ref 0.0–1.2)
CO2: 28 mmol/L (ref 20–29)
Calcium: 9.8 mg/dL (ref 8.7–10.2)
Chloride: 95 mmol/L — ABNORMAL LOW (ref 96–106)
Creatinine, Ser: 0.9 mg/dL (ref 0.57–1.00)
GFR calc Af Amer: 94 mL/min/{1.73_m2} (ref >59)
GFR calc non Af Amer: 81 mL/min/{1.73_m2} (ref >59)
Globulin, Total: 2.6 g/dL (ref 1.5–4.5)
Glucose: 88 mg/dL (ref 65–99)
Potassium: 3.2 mmol/L — ABNORMAL LOW (ref 3.5–5.2)
Sodium: 137 mmol/L (ref 134–144)
Total Protein: 7.5 g/dL (ref 6.0–8.5)

## 2018-11-29 LAB — CBC
Hematocrit: 40.4 % (ref 34.0–46.6)
Hemoglobin: 13.6 g/dL (ref 11.1–15.9)
MCH: 30 pg (ref 26.6–33.0)
MCHC: 33.7 g/dL (ref 31.5–35.7)
MCV: 89 fL (ref 79–97)
Platelets: 248 10*3/uL (ref 150–450)
RBC: 4.54 x10E6/uL (ref 3.77–5.28)
RDW: 13.4 % (ref 11.7–15.4)
WBC: 7.7 10*3/uL (ref 3.4–10.8)

## 2018-11-29 LAB — VITAMIN D 25 HYDROXY (VIT D DEFICIENCY, FRACTURES): Vit D, 25-Hydroxy: 33.7 ng/mL (ref 30.0–100.0)

## 2018-11-30 ENCOUNTER — Other Ambulatory Visit: Payer: Self-pay

## 2018-11-30 DIAGNOSIS — E876 Hypokalemia: Secondary | ICD-10-CM

## 2018-12-14 ENCOUNTER — Other Ambulatory Visit: Payer: Medicare Other

## 2018-12-14 ENCOUNTER — Other Ambulatory Visit: Payer: Self-pay | Admitting: Nurse Practitioner

## 2018-12-14 ENCOUNTER — Other Ambulatory Visit: Payer: Self-pay

## 2018-12-15 LAB — POTASSIUM: Potassium: 3.2 mmol/L — ABNORMAL LOW (ref 3.5–5.2)

## 2018-12-19 ENCOUNTER — Other Ambulatory Visit: Payer: Self-pay | Admitting: Nurse Practitioner

## 2018-12-19 DIAGNOSIS — E876 Hypokalemia: Secondary | ICD-10-CM

## 2018-12-19 MED ORDER — POTASSIUM CHLORIDE ER 10 MEQ PO TBCR: 10.0000 meq | EXTENDED_RELEASE_TABLET | Freq: Every day | ORAL | 0 refills | Status: DC

## 2018-12-22 DIAGNOSIS — Z6830 Body mass index (BMI) 30.0-30.9, adult: Secondary | ICD-10-CM | POA: Diagnosis not present

## 2018-12-22 DIAGNOSIS — Z20828 Contact with and (suspected) exposure to other viral communicable diseases: Secondary | ICD-10-CM | POA: Diagnosis not present

## 2018-12-26 DIAGNOSIS — Z20828 Contact with and (suspected) exposure to other viral communicable diseases: Secondary | ICD-10-CM | POA: Diagnosis not present

## 2018-12-28 ENCOUNTER — Encounter: Payer: Self-pay | Admitting: Nurse Practitioner

## 2018-12-28 ENCOUNTER — Other Ambulatory Visit: Payer: Self-pay

## 2018-12-28 DIAGNOSIS — E876 Hypokalemia: Secondary | ICD-10-CM

## 2019-01-01 ENCOUNTER — Other Ambulatory Visit: Payer: Medicare Other

## 2019-01-01 ENCOUNTER — Other Ambulatory Visit: Payer: Self-pay | Admitting: Nurse Practitioner

## 2019-01-01 ENCOUNTER — Other Ambulatory Visit: Payer: Self-pay

## 2019-01-02 LAB — POTASSIUM: Potassium: 3.4 mmol/L — ABNORMAL LOW (ref 3.5–5.2)

## 2019-01-08 ENCOUNTER — Other Ambulatory Visit: Payer: Medicare Other

## 2019-01-15 ENCOUNTER — Other Ambulatory Visit: Payer: Self-pay | Admitting: Nurse Practitioner

## 2019-01-15 DIAGNOSIS — E876 Hypokalemia: Secondary | ICD-10-CM

## 2019-01-15 MED ORDER — POTASSIUM CHLORIDE ER 10 MEQ PO TBCR: 10.0000 meq | EXTENDED_RELEASE_TABLET | Freq: Every day | ORAL | 1 refills | Status: DC

## 2019-01-20 NOTE — Progress Notes (Signed)
Move her to the middle of February

## 2019-02-09 ENCOUNTER — Other Ambulatory Visit: Payer: Self-pay | Admitting: Nurse Practitioner

## 2019-03-13 DIAGNOSIS — Z20828 Contact with and (suspected) exposure to other viral communicable diseases: Secondary | ICD-10-CM | POA: Diagnosis not present

## 2019-03-14 ENCOUNTER — Other Ambulatory Visit: Payer: Medicare Other

## 2019-03-14 ENCOUNTER — Ambulatory Visit: Payer: Medicare Other | Admitting: Nurse Practitioner

## 2019-03-14 ENCOUNTER — Other Ambulatory Visit: Payer: Self-pay

## 2019-03-14 DIAGNOSIS — E876 Hypokalemia: Secondary | ICD-10-CM

## 2019-03-15 LAB — POTASSIUM: Potassium: 3.8 mmol/L (ref 3.5–5.2)

## 2019-03-30 DIAGNOSIS — Z20828 Contact with and (suspected) exposure to other viral communicable diseases: Secondary | ICD-10-CM | POA: Diagnosis not present

## 2019-04-10 DIAGNOSIS — H35342 Macular cyst, hole, or pseudohole, left eye: Secondary | ICD-10-CM | POA: Diagnosis not present

## 2019-04-10 DIAGNOSIS — H33002 Unspecified retinal detachment with retinal break, left eye: Secondary | ICD-10-CM | POA: Diagnosis not present

## 2019-04-10 DIAGNOSIS — Q141 Congenital malformation of retina: Secondary | ICD-10-CM | POA: Diagnosis not present

## 2019-04-11 ENCOUNTER — Other Ambulatory Visit: Payer: Self-pay

## 2019-04-11 ENCOUNTER — Encounter: Payer: Self-pay | Admitting: Nurse Practitioner

## 2019-04-11 ENCOUNTER — Ambulatory Visit: Payer: Medicare Other | Admitting: Nurse Practitioner

## 2019-04-11 ENCOUNTER — Ambulatory Visit (INDEPENDENT_AMBULATORY_CARE_PROVIDER_SITE_OTHER): Payer: Medicare Other | Admitting: Nurse Practitioner

## 2019-04-11 ENCOUNTER — Ambulatory Visit (INDEPENDENT_AMBULATORY_CARE_PROVIDER_SITE_OTHER): Payer: Medicare Other

## 2019-04-11 VITALS — BP 124/78 | HR 99 | Temp 98.5°F | Ht 62.6 in | Wt 159.6 lb

## 2019-04-11 VITALS — BP 124/78 | HR 99 | Temp 98.5°F | Ht 62.6 in | Wt 159.0 lb

## 2019-04-11 DIAGNOSIS — Z0000 Encounter for general adult medical examination without abnormal findings: Secondary | ICD-10-CM | POA: Diagnosis not present

## 2019-04-11 DIAGNOSIS — I10 Essential (primary) hypertension: Secondary | ICD-10-CM | POA: Diagnosis not present

## 2019-04-11 NOTE — Progress Notes (Signed)
This visit occurred during the SARS-CoV-2 public health emergency.  Safety protocols were in place, including screening questions prior to the visit, additional usage of staff PPE, and extensive cleaning of exam room while observing appropriate contact time as indicated for disinfecting solutions.  Subjective:     Patient ID: Yesenia Velasquez , female    DOB: 10-28-1980 , 39 y.o.   MRN: [id number]   Chief Complaint  Patient presents with  . Hypertension    HPI  She is scheduled to get the covid vaccine on Saturday morning.    Wt Readings from Last 3 Encounters: 04/11/19 : 159 lb 9.6 oz (72.4 kg) 11/28/18 : 169 lb 3.2 oz (76.7 kg) 09/11/18 : 163 lb 3.2 oz (74 kg)  She is exercising 3 days a week and trying to stay well hydrated with water  Hypertension This is a new problem. The current episode started more than 1 year ago. The problem is unchanged. The problem is controlled. Pertinent negatives include no anxiety, chest pain, headaches or palpitations. There are no associated agents to hypertension. Risk factors for coronary artery disease include sedentary lifestyle. Past treatments include diuretics. There are no compliance problems.  There is no history of angina. There is no history of chronic renal disease.     Past Medical History:  Diagnosis Date  . Hypertension      No family history on file.   Current Outpatient Medications:  .  aspirin EC 81 MG tablet, Take 81 mg by mouth daily., Disp: , Rfl:  .  hydrochlorothiazide (HYDRODIURIL) 25 MG tablet, Take 1 tablet by mouth once daily, Disp: 90 tablet, Rfl: 0 .  norethindrone-ethinyl estradiol (MICROGESTIN) 1-20 MG-MCG tablet, Take 1 tablet by mouth daily., Disp: 63 tablet, Rfl: 3   No Known Allergies   Review of Systems  Constitutional: Negative.   Respiratory: Negative.  Negative for cough.   Cardiovascular: Negative for chest pain, palpitations and leg swelling.  Neurological: Negative for dizziness and headaches.   Psychiatric/Behavioral: Negative.      Today's Vitals   04/11/19 1414  BP: 124/78  Pulse: 99  Temp: 98.5 F (36.9 C)  TempSrc: Oral  SpO2: 99%  Weight: 159 lb 9.6 oz (72.4 kg)  Height: 5' 2.6" (1.59 m)   Body mass index is 28.63 kg/m.   Objective:  Physical Exam Constitutional:      General: She is not in acute distress.    Appearance: Normal appearance.  Cardiovascular:     Rate and Rhythm: Normal rate and regular rhythm.     Pulses: Normal pulses.     Heart sounds: Normal heart sounds. No murmur.  Pulmonary:     Effort: Pulmonary effort is normal. No respiratory distress.     Breath sounds: Normal breath sounds.  Skin:    Capillary Refill: Capillary refill takes less than 2 seconds.  Neurological:     General: No focal deficit present.     Mental Status: She is alert and oriented to person, place, and time.     Cranial Nerves: No cranial nerve deficit.  Psychiatric:        Mood and Affect: Mood normal.        Behavior: Behavior normal.        Thought Content: Thought content normal.        Judgment: Judgment normal.         Assessment And Plan:     1. Essential hypertension  Chronic, great control  She has lost 10  lbs since her last visit  Continue with current medications if can maintain her weight and her blood pressure is well controlled will consider decreasing medication,    Minette Brine, FNP    THE PATIENT IS ENCOURAGED TO PRACTICE SOCIAL DISTANCING DUE TO THE COVID-19 PANDEMIC.

## 2019-04-11 NOTE — Progress Notes (Signed)
This visit occurred during the SARS-CoV-2 public health emergency.  Safety protocols were in place, including screening questions prior to the visit, additional usage of staff PPE, and extensive cleaning of exam room while observing appropriate contact time as indicated for disinfecting solutions.  Subjective:   Yesenia Velasquez is a 39 y.o. female who presents for Medicare Annual (Subsequent) preventive examination.  Review of Systems:  n/a Cardiac Risk Factors include: hypertension     Objective:     Vitals: BP 124/78 (BP Location: Left Arm, Patient Position: Sitting)   Pulse 99   Temp 98.5 F (36.9 C) (Oral)   Ht 5' 2.6" (1.59 m)   Wt 159 lb (72.1 kg)   LMP 03/20/2019   BMI 28.53 kg/m   Body mass index is 28.53 kg/m.  Advanced Directives 04/11/2019 04/06/2018  Does Patient Have a Medical Advance Directive? No No  Would patient like information on creating a medical advance directive? - Yes (MAU/Ambulatory/Procedural Areas - Information given)    Tobacco Social History   Tobacco Use  Smoking Status Never Smoker  Smokeless Tobacco Never Used     Counseling given: Not Answered   Clinical Intake:  Pre-visit preparation completed: Yes  Pain : No/denies pain     Nutritional Status: BMI 25 -29 Overweight Nutritional Risks: None Diabetes: No  How often do you need to have someone help you when you read instructions, pamphlets, or other written materials from your doctor or pharmacy?: 1 - Never What is the last grade level you completed in school?: 12th grade  Interpreter Needed?: No  Information entered by :: NAllen LPN  Past Medical History:  Diagnosis Date  . Hypertension    History reviewed. No pertinent surgical history. History reviewed. No pertinent family history. Social History   Socioeconomic History  . Marital status: Single    Spouse name: Not on file  . Number of children: Not on file  . Years of education: Not on file  . Highest education  level: Not on file  Occupational History  . Not on file  Tobacco Use  . Smoking status: Never Smoker  . Smokeless tobacco: Never Used  Substance and Sexual Activity  . Alcohol use: Yes    Comment: SOCIALLY  . Drug use: Not Currently  . Sexual activity: Not Currently  Other Topics Concern  . Not on file  Social History Narrative  . Not on file   Social Determinants of Health   Financial Resource Strain: Low Risk   . Difficulty of Paying Living Expenses: Not hard at all  Food Insecurity: No Food Insecurity  . Worried About Charity fundraiser in the Last Year: Never true  . Ran Out of Food in the Last Year: Never true  Transportation Needs: No Transportation Needs  . Lack of Transportation (Medical): No  . Lack of Transportation (Non-Medical): No  Physical Activity: Insufficiently Active  . Days of Exercise per Week: 5 days  . Minutes of Exercise per Session: 20 min  Stress: No Stress Concern Present  . Feeling of Stress : Not at all  Social Connections:   . Frequency of Communication with Friends and Family:   . Frequency of Social Gatherings with Friends and Family:   . Attends Religious Services:   . Active Member of Clubs or Organizations:   . Attends Archivist Meetings:   Marland Kitchen Marital Status:     Outpatient Encounter Medications as of 04/11/2019  Medication Sig  . aspirin EC 81 MG  tablet Take 81 mg by mouth daily.  . hydrochlorothiazide (HYDRODIURIL) 25 MG tablet Take 1 tablet by mouth once daily  . norethindrone-ethinyl estradiol (MICROGESTIN) 1-20 MG-MCG tablet Take 1 tablet by mouth daily.   No facility-administered encounter medications on file as of 04/11/2019.    Activities of Daily Living In your present state of health, do you have any difficulty performing the following activities: 04/11/2019 04/11/2019  Hearing? N N  Vision? N Y  Comment - legally blind  Difficulty concentrating or making decisions? N N  Walking or climbing stairs? N N    Dressing or bathing? N N  Doing errands, shopping? N N  Preparing Food and eating ? N -  Using the Toilet? N -  In the past six months, have you accidently leaked urine? N -  Do you have problems with loss of bowel control? N -  Managing your Medications? N -  Managing your Finances? N -  Housekeeping or managing your Housekeeping? N -  Some recent data might be hidden    Patient Care Team: Minette Brine, FNP as PCP - General (General Practice)    Assessment:   This is a routine wellness examination for Yesenia Velasquez.  Exercise Activities and Dietary recommendations Current Exercise Habits: Home exercise routine, Type of exercise: walking, Time (Minutes): 20, Frequency (Times/Week): 5, Weekly Exercise (Minutes/Week): 100  Goals    . Exercise 150 min/wk Moderate Activity (pt-stated)     Wants to exercise more to lose stomach    . Patient Stated     04/11/2019, so to maintain weight loss and keep exercising       Fall Risk Fall Risk  04/11/2019 11/28/2018 09/11/2018 06/20/2018 04/06/2018  Falls in the past year? 0 0 0 0 0  Risk for fall due to : - - - - Medication side effect  Follow up Falls evaluation completed;Education provided;Falls prevention discussed - - - Education provided;Falls prevention discussed   Is the patient's home free of loose throw rugs in walkways, pet beds, electrical cords, etc?   yes      Grab bars in the bathroom? yes      Handrails on the stairs?   yes      Adequate lighting?   yes  Timed Get Up and Go performed: n/a  Depression Screen PHQ 2/9 Scores 04/11/2019 11/28/2018 09/11/2018 06/20/2018  PHQ - 2 Score 0 0 0 0  PHQ- 9 Score 0 - - -     Cognitive Function     6CIT Screen 04/11/2019 04/06/2018  What Year? 0 points 0 points  What month? 0 points 0 points  What time? 0 points 0 points  Count back from 20 0 points 0 points  Months in reverse 0 points 0 points  Repeat phrase 0 points 0 points  Total Score 0 0    Immunization History   Administered Date(s) Administered  . Influenza Whole 09/26/2015  . Influenza-Unspecified 10/22/2016, 11/07/2017, 11/01/2018    Qualifies for Shingles Vaccine? n/a  Screening Tests Health Maintenance  Topic Date Due  . PAP SMEAR-Modifier  11/14/2019  . TETANUS/TDAP  11/14/2023  . INFLUENZA VACCINE  Completed  . HIV Screening  Completed    Cancer Screenings: Lung: Low Dose CT Chest recommended if Age 25-80 years, 30 pack-year currently smoking OR have quit w/in 15years. Patient does not qualify. Breast:  Up to date on Mammogram? Yes   Up to date of Bone Density/Dexa? n/a Colorectal: n/a  Additional Screenings: : Hepatitis C Screening:  n/a     Plan:    Patient wants to maintain weight loss and continue exercising.   I have personally reviewed and noted the following in the patient's chart:   . Medical and social history . Use of alcohol, tobacco or illicit drugs  . Current medications and supplements . Functional ability and status . Nutritional status . Physical activity . Advanced directives . List of other physicians . Hospitalizations, surgeries, and ER visits in previous 12 months . Vitals . Screenings to include cognitive, depression, and falls . Referrals and appointments  In addition, I have reviewed and discussed with patient certain preventive protocols, quality metrics, and best practice recommendations. A written personalized care plan for preventive services as well as general preventive health recommendations were provided to patient.     Kellie Simmering, LPN  [phone]

## 2019-04-11 NOTE — Patient Instructions (Signed)
CONGRATULATIONS ON YOUR 10 LB WEIGHT LOSS! CONTINUE WHAT YOU ARE DOING GREAT JOB!

## 2019-04-11 NOTE — Patient Instructions (Signed)
Yesenia Velasquez , Thank you for taking time to come for your Medicare Wellness Visit. I appreciate your ongoing commitment to your health goals. Please review the following plan we discussed and let me know if I can assist you in the future.   Screening recommendations/referrals: Colonoscopy: n/a Mammogram: n/a Bone Density: n/a Recommended yearly ophthalmology/optometry visit for glaucoma screening and checkup Recommended yearly dental visit for hygiene and checkup  Vaccinations: Influenza vaccine: 10/2018 Pneumococcal vaccine: n/a Tdap vaccine: 10/2013 Shingles vaccine: n/a    Advanced directives: Advance directive discussed with you today. Even though you declined this today please call our office should you change your mind and we can give you the proper paperwork for you to fill out.   Conditions/risks identified: overweight  Next appointment: 12/04/2019 at 3:00  Preventive Care 40-64 Years, Female Preventive care refers to lifestyle choices and visits with your health care provider that can promote health and wellness. What does preventive care include?  A yearly physical exam. This is also called an annual well check.  Dental exams once or twice a year.  Routine eye exams. Ask your health care provider how often you should have your eyes checked.  Personal lifestyle choices, including:  Daily care of your teeth and gums.  Regular physical activity.  Eating a healthy diet.  Avoiding tobacco and drug use.  Limiting alcohol use.  Practicing safe sex.  Taking low-dose aspirin daily starting at age 65.  Taking vitamin and mineral supplements as recommended by your health care provider. What happens during an annual well check? The services and screenings done by your health care provider during your annual well check will depend on your age, overall health, lifestyle risk factors, and family history of disease. Counseling  Your health care provider may ask you  questions about your:  Alcohol use.  Tobacco use.  Drug use.  Emotional well-being.  Home and relationship well-being.  Sexual activity.  Eating habits.  Work and work Statistician.  Method of birth control.  Menstrual cycle.  Pregnancy history. Screening  You may have the following tests or measurements:  Height, weight, and BMI.  Blood pressure.  Lipid and cholesterol levels. These may be checked every 5 years, or more frequently if you are over 74 years old.  Skin check.  Lung cancer screening. You may have this screening every year starting at age 74 if you have a 30-pack-year history of smoking and currently smoke or have quit within the past 15 years.  Fecal occult blood test (FOBT) of the stool. You may have this test every year starting at age 9.  Flexible sigmoidoscopy or colonoscopy. You may have a sigmoidoscopy every 5 years or a colonoscopy every 10 years starting at age 27.  Hepatitis C blood test.  Hepatitis B blood test.  Sexually transmitted disease (STD) testing.  Diabetes screening. This is done by checking your blood sugar (glucose) after you have not eaten for a while (fasting). You may have this done every 1-3 years.  Mammogram. This may be done every 1-2 years. Talk to your health care provider about when you should start having regular mammograms. This may depend on whether you have a family history of breast cancer.  BRCA-related cancer screening. This may be done if you have a family history of breast, ovarian, tubal, or peritoneal cancers.  Pelvic exam and Pap test. This may be done every 3 years starting at age 52. Starting at age 55, this may be done every 5 years  if you have a Pap test in combination with an HPV test.  Bone density scan. This is done to screen for osteoporosis. You may have this scan if you are at high risk for osteoporosis. Discuss your test results, treatment options, and if necessary, the need for more tests with  your health care provider. Vaccines  Your health care provider may recommend certain vaccines, such as:  Influenza vaccine. This is recommended every year.  Tetanus, diphtheria, and acellular pertussis (Tdap, Td) vaccine. You may need a Td booster every 10 years.  Zoster vaccine. You may need this after age 39.  Pneumococcal 13-valent conjugate (PCV13) vaccine. You may need this if you have certain conditions and were not previously vaccinated.  Pneumococcal polysaccharide (PPSV23) vaccine. You may need one or two doses if you smoke cigarettes or if you have certain conditions. Talk to your health care provider about which screenings and vaccines you need and how often you need them. This information is not intended to replace advice given to you by your health care provider. Make sure you discuss any questions you have with your health care provider. Document Released: 02/07/2015 Document Revised: 10/01/2015 Document Reviewed: 11/12/2014 Elsevier Interactive Patient Education  2017 [place] Prevention in the Home Falls can cause injuries. They can happen to people of all ages. There are many things you can do to make your home safe and to help prevent falls. What can I do on the outside of my home?  Regularly fix the edges of walkways and driveways and fix any cracks.  Remove anything that might make you trip as you walk through a door, such as a raised step or threshold.  Trim any bushes or trees on the path to your home.  Use bright outdoor lighting.  Clear any walking paths of anything that might make someone trip, such as rocks or tools.  Regularly check to see if handrails are loose or broken. Make sure that both sides of any steps have handrails.  Any raised decks and porches should have guardrails on the edges.  Have any leaves, snow, or ice cleared regularly.  Use sand or salt on walking paths during winter.  Clean up any spills in your garage right  away. This includes oil or grease spills. What can I do in the bathroom?  Use night lights.  Install grab bars by the toilet and in the tub and shower. Do not use towel bars as grab bars.  Use non-skid mats or decals in the tub or shower.  If you need to sit down in the shower, use a plastic, non-slip stool.  Keep the floor dry. Clean up any water that spills on the floor as soon as it happens.  Remove soap buildup in the tub or shower regularly.  Attach bath mats securely with double-sided non-slip rug tape.  Do not have throw rugs and other things on the floor that can make you trip. What can I do in the bedroom?  Use night lights.  Make sure that you have a light by your bed that is easy to reach.  Do not use any sheets or blankets that are too big for your bed. They should not hang down onto the floor.  Have a firm chair that has side arms. You can use this for support while you get dressed.  Do not have throw rugs and other things on the floor that can make you trip. What can I do in  the kitchen?  Clean up any spills right away.  Avoid walking on wet floors.  Keep items that you use a lot in easy-to-reach places.  If you need to reach something above you, use a strong step stool that has a grab bar.  Keep electrical cords out of the way.  Do not use floor polish or wax that makes floors slippery. If you must use wax, use non-skid floor wax.  Do not have throw rugs and other things on the floor that can make you trip. What can I do with my stairs?  Do not leave any items on the stairs.  Make sure that there are handrails on both sides of the stairs and use them. Fix handrails that are broken or loose. Make sure that handrails are as long as the stairways.  Check any carpeting to make sure that it is firmly attached to the stairs. Fix any carpet that is loose or worn.  Avoid having throw rugs at the top or bottom of the stairs. If you do have throw rugs, attach  them to the floor with carpet tape.  Make sure that you have a light switch at the top of the stairs and the bottom of the stairs. If you do not have them, ask someone to add them for you. What else can I do to help prevent falls?  Wear shoes that:  Do not have high heels.  Have rubber bottoms.  Are comfortable and fit you well.  Are closed at the toe. Do not wear sandals.  If you use a stepladder:  Make sure that it is fully opened. Do not climb a closed stepladder.  Make sure that both sides of the stepladder are locked into place.  Ask someone to hold it for you, if possible.  Clearly mark and make sure that you can see:  Any grab bars or handrails.  First and last steps.  Where the edge of each step is.  Use tools that help you move around (mobility aids) if they are needed. These include:  Canes.  Walkers.  Scooters.  Crutches.  Turn on the lights when you go into a dark area. Replace any light bulbs as soon as they burn out.  Set up your furniture so you have a clear path. Avoid moving your furniture around.  If any of your floors are uneven, fix them.  If there are any pets around you, be aware of where they are.  Review your medicines with your doctor. Some medicines can make you feel dizzy. This can increase your chance of falling. Ask your doctor what other things that you can do to help prevent falls. This information is not intended to replace advice given to you by your health care provider. Make sure you discuss any questions you have with your health care provider. Document Released: 11/07/2008 Document Revised: 06/19/2015 Document Reviewed: 02/15/2014 Elsevier Interactive Patient Education  2017 [employer].

## 2019-04-16 ENCOUNTER — Ambulatory Visit: Payer: Medicare Other

## 2019-04-20 DIAGNOSIS — Z20828 Contact with and (suspected) exposure to other viral communicable diseases: Secondary | ICD-10-CM | POA: Diagnosis not present

## 2019-05-04 DIAGNOSIS — Z20828 Contact with and (suspected) exposure to other viral communicable diseases: Secondary | ICD-10-CM | POA: Diagnosis not present

## 2019-06-06 DIAGNOSIS — S92501A Displaced unspecified fracture of right lesser toe(s), initial encounter for closed fracture: Secondary | ICD-10-CM | POA: Diagnosis not present

## 2019-06-07 DIAGNOSIS — S92511A Displaced fracture of proximal phalanx of right lesser toe(s), initial encounter for closed fracture: Secondary | ICD-10-CM | POA: Diagnosis not present

## 2019-06-07 DIAGNOSIS — M79674 Pain in right toe(s): Secondary | ICD-10-CM | POA: Diagnosis not present

## 2019-06-22 DIAGNOSIS — S92501D Displaced unspecified fracture of right lesser toe(s), subsequent encounter for fracture with routine healing: Secondary | ICD-10-CM | POA: Diagnosis not present

## 2019-06-22 DIAGNOSIS — M79671 Pain in right foot: Secondary | ICD-10-CM | POA: Diagnosis not present

## 2019-07-13 DIAGNOSIS — S92911D Unspecified fracture of right toe(s), subsequent encounter for fracture with routine healing: Secondary | ICD-10-CM | POA: Diagnosis not present

## 2019-07-13 DIAGNOSIS — M79671 Pain in right foot: Secondary | ICD-10-CM | POA: Diagnosis not present

## 2019-08-10 DIAGNOSIS — S92501D Displaced unspecified fracture of right lesser toe(s), subsequent encounter for fracture with routine healing: Secondary | ICD-10-CM | POA: Diagnosis not present

## 2019-08-10 DIAGNOSIS — M79671 Pain in right foot: Secondary | ICD-10-CM | POA: Diagnosis not present

## 2019-08-13 ENCOUNTER — Other Ambulatory Visit: Payer: Self-pay | Admitting: Nurse Practitioner

## 2019-08-13 ENCOUNTER — Ambulatory Visit (INDEPENDENT_AMBULATORY_CARE_PROVIDER_SITE_OTHER): Payer: Medicare Other | Admitting: Nurse Practitioner

## 2019-08-13 ENCOUNTER — Encounter: Payer: Self-pay | Admitting: Nurse Practitioner

## 2019-08-13 ENCOUNTER — Other Ambulatory Visit: Payer: Self-pay

## 2019-08-13 VITALS — BP 118/76 | HR 84 | Temp 98.3°F | Ht 62.0 in | Wt 162.8 lb

## 2019-08-13 DIAGNOSIS — I10 Essential (primary) hypertension: Secondary | ICD-10-CM | POA: Diagnosis not present

## 2019-08-13 DIAGNOSIS — Z1159 Encounter for screening for other viral diseases: Secondary | ICD-10-CM

## 2019-08-13 NOTE — Progress Notes (Signed)
This visit occurred during the SARS-CoV-2 public health emergency.  Safety protocols were in place, including screening questions prior to the visit, additional usage of staff PPE, and extensive cleaning of exam room while observing appropriate contact time as indicated for disinfecting solutions.  Subjective:     Patient ID: Yesenia Velasquez , female    DOB: [birth year] , 39 y.o.   MRN: [id number]   Chief Complaint  Patient presents with  . Hypertension    HPI  Presents for follow up for her HTN. She is compliant with medications and reports no side effects. She is watching her diet and is exercising at the gym three days per week. She has no complaints currently and doesn't need any refills in her medication. We will check her labs today.  Wt Readings from Last 3 Encounters: 08/13/19 : 162 lb 12.8 oz (73.8 kg) 04/11/19 : 159 lb 9.6 oz (72.4 kg) 04/11/19 : 159 lb (72.1 kg)   Hypertension This is a chronic problem. The current episode started more than 1 year ago. The problem has been gradually improving since onset. The problem is controlled. Pertinent negatives include no chest pain, headaches or palpitations. Agents associated with hypertension include oral contraceptives. There are no known risk factors for coronary artery disease. Past treatments include diuretics. The current treatment provides moderate improvement. There are no compliance problems.      Past Medical History:  Diagnosis Date  . Hypertension      History reviewed. No pertinent family history.   Current Outpatient Medications:  .  aspirin EC 81 MG tablet, Take 81 mg by mouth daily., Disp: , Rfl:  .  hydrochlorothiazide (HYDRODIURIL) 25 MG tablet, Take 1 tablet by mouth once daily, Disp: 90 tablet, Rfl: 0 .  norethindrone-ethinyl estradiol (MICROGESTIN) 1-20 MG-MCG tablet, Take 1 tablet by mouth daily., Disp: 63 tablet, Rfl: 3   No Known Allergies   Review of Systems  Constitutional: Negative.    Respiratory: Negative for chest tightness.   Cardiovascular: Negative for chest pain and palpitations.  Gastrointestinal: Negative.   Neurological: Negative for light-headedness and headaches.     Today's Vitals   08/13/19 1517  BP: 118/76  Pulse: 84  Temp: 98.3 F (36.8 C)  TempSrc: Oral  Weight: 162 lb 12.8 oz (73.8 kg)  Height: 5' 2" (1.575 m)  PainSc: 0-No pain   Body mass index is 29.78 kg/m.   Objective:  Physical Exam Constitutional:      Appearance: Normal appearance. She is normal weight.  Cardiovascular:     Rate and Rhythm: Normal rate and regular rhythm.     Pulses: Normal pulses.     Heart sounds: Normal heart sounds.  Pulmonary:     Effort: Pulmonary effort is normal.     Breath sounds: Normal breath sounds. No wheezing.  Chest:     Chest wall: No tenderness.  Skin:    General: Skin is warm and dry.     Capillary Refill: Capillary refill takes less than 2 seconds.  Neurological:     Mental Status: She is alert and oriented to person, place, and time.  Psychiatric:        Mood and Affect: Mood normal.        Behavior: Behavior normal.        Thought Content: Thought content normal.        Judgment: Judgment normal.         Assessment And Plan:     1. Essential hypertension .  B/P is well controlled.  Marland Kitchen BMP ordered to check renal function.  . The importance of regular exercise and dietary modification was stressed to the patient.  - BMP8+eGFR  2. Encounter for hepatitis C screening test for low risk patient  Will check Hepatitis C screening due to recent recommendations to screen all adults 18 years and older - Hepatitis C antibody     Patient was given opportunity to ask questions. Patient verbalized understanding of the plan and was able to repeat key elements of the plan. All questions were answered to their satisfaction.  Minette Brine, FNP   I, Minette Brine, FNP, have reviewed all documentation for this visit. The documentation on  08/13/19 for the exam, diagnosis, procedures, and orders are all accurate and complete.  THE PATIENT IS ENCOURAGED TO PRACTICE SOCIAL DISTANCING DUE TO THE COVID-19 PANDEMIC.

## 2019-08-14 LAB — BMP8+EGFR
BUN/Creatinine Ratio: 11 (ref 9–23)
BUN: 10 mg/dL (ref 6–20)
CO2: 22 mmol/L (ref 20–29)
Calcium: 9 mg/dL (ref 8.7–10.2)
Chloride: 102 mmol/L (ref 96–106)
Creatinine, Ser: 0.88 mg/dL (ref 0.57–1.00)
GFR calc Af Amer: 96 mL/min/{1.73_m2} (ref >59)
GFR calc non Af Amer: 83 mL/min/{1.73_m2} (ref >59)
Glucose: 75 mg/dL (ref 65–99)
Potassium: 3.6 mmol/L (ref 3.5–5.2)
Sodium: 138 mmol/L (ref 134–144)

## 2019-08-14 LAB — HEPATITIS C ANTIBODY: Hep C Virus Ab: <0.1 s/co ratio (ref 0.0–0.9)

## 2019-09-10 DIAGNOSIS — M79671 Pain in right foot: Secondary | ICD-10-CM | POA: Diagnosis not present

## 2019-09-10 DIAGNOSIS — S92911D Unspecified fracture of right toe(s), subsequent encounter for fracture with routine healing: Secondary | ICD-10-CM | POA: Diagnosis not present

## 2019-09-17 DIAGNOSIS — M79674 Pain in right toe(s): Secondary | ICD-10-CM | POA: Diagnosis not present

## 2019-11-09 DIAGNOSIS — Z23 Encounter for immunization: Secondary | ICD-10-CM | POA: Diagnosis not present

## 2019-11-12 IMAGING — MG DIGITAL DIAGNOSTIC BILATERAL MAMMOGRAM WITH TOMO AND CAD
8 series · 8 of 24 positions shown · non-contrast
Comparison: None.

CLINICAL DATA: 37-year-old female presenting for baseline mammogram
to evaluate a palpable lump in the right breast at 2 o'clock
identified on clinical breast exam.

EXAM:
DIGITAL DIAGNOSTIC BILATERAL MAMMOGRAM WITH CAD AND TOMO
ULTRASOUND RIGHT BREAST

[L MLO synth-2D]
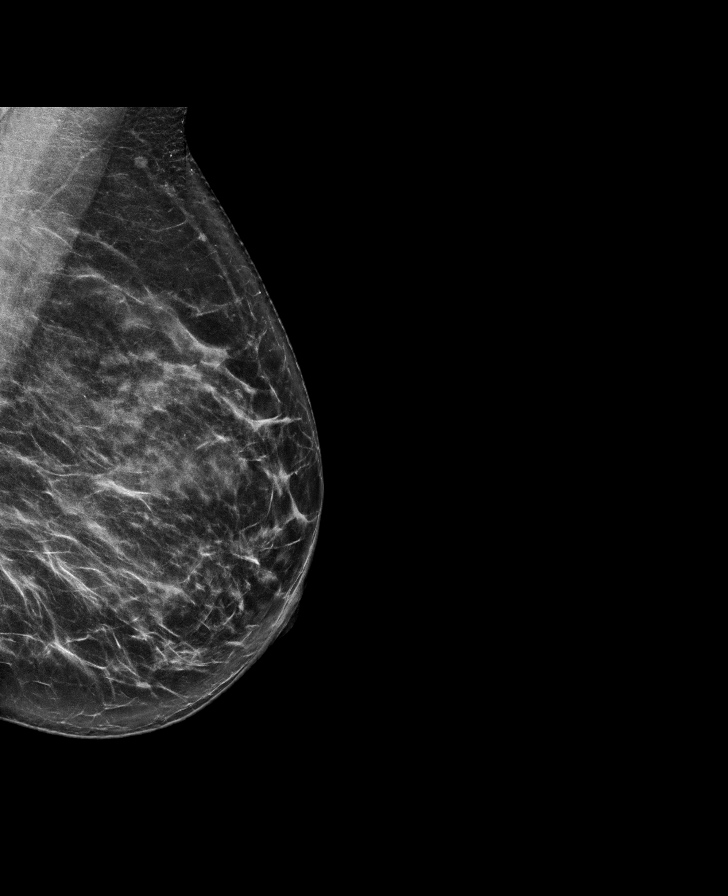

[L CC synth-2D]
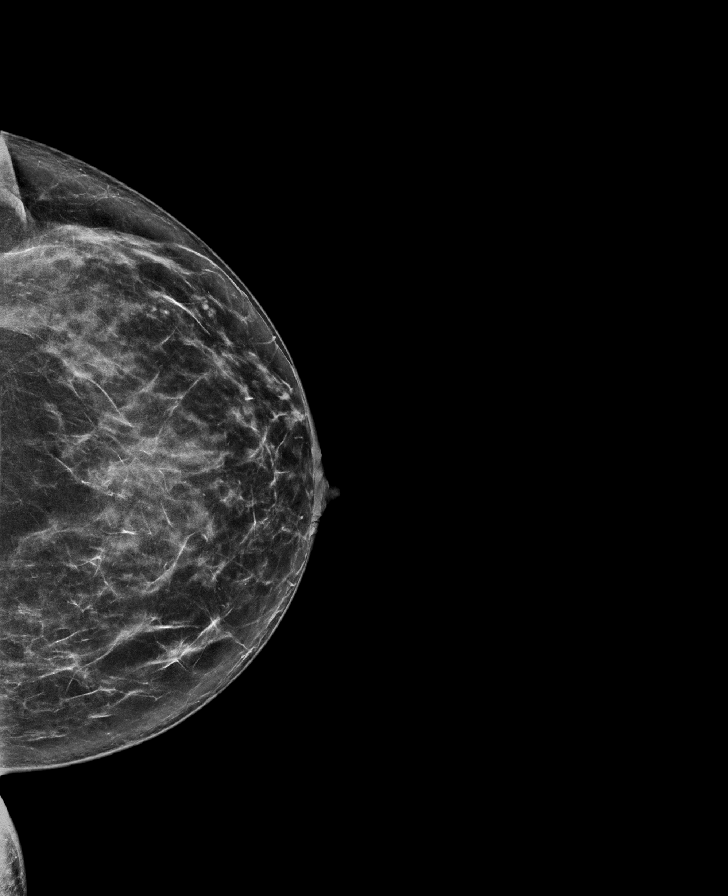

[R MLO synth-2D]
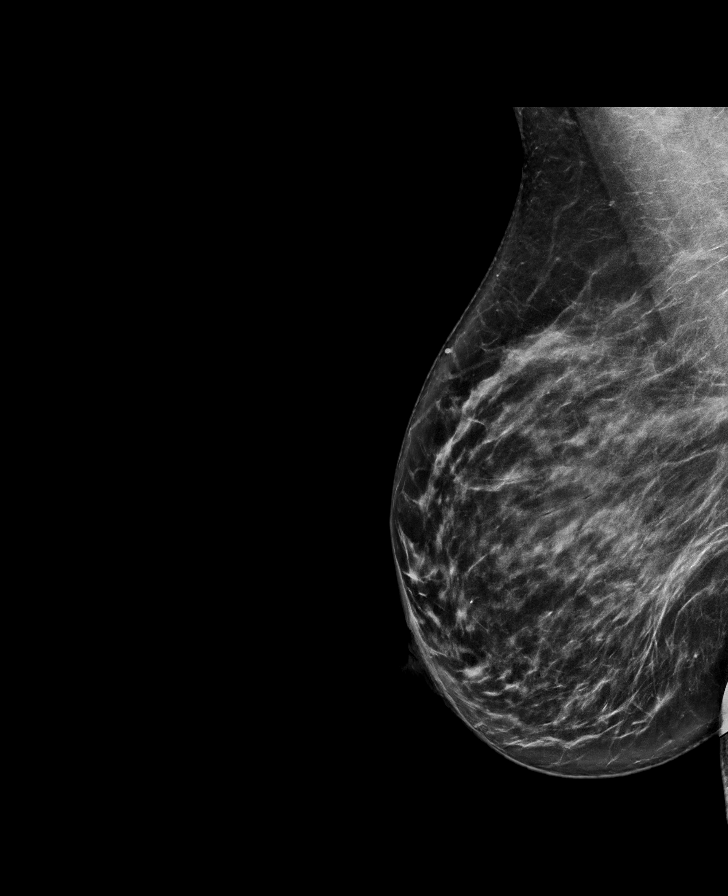

[R CC synth-2D]
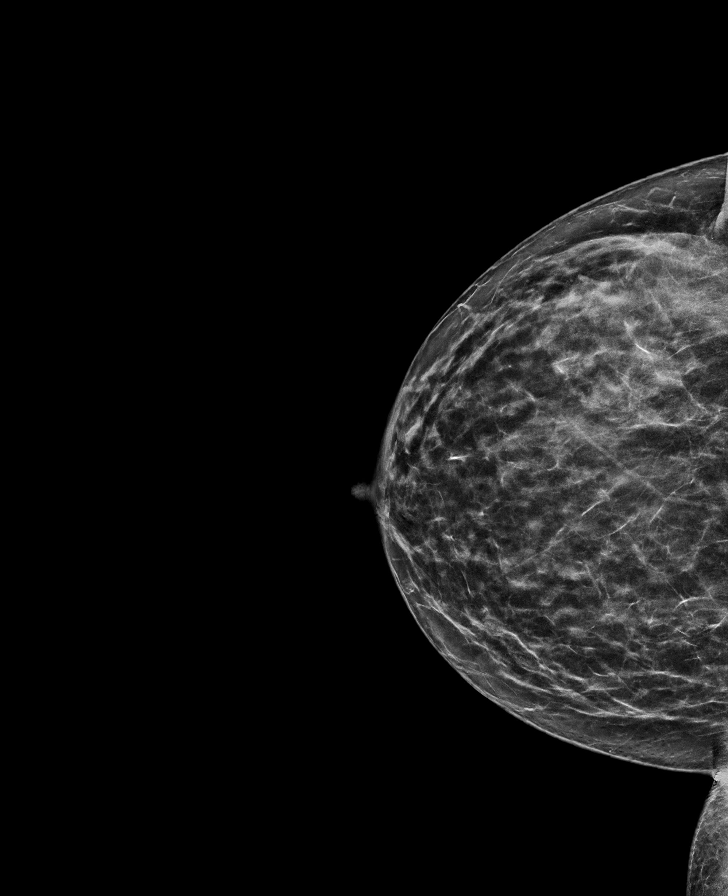

[L MLO tomo · tomo slice 37/74.0]
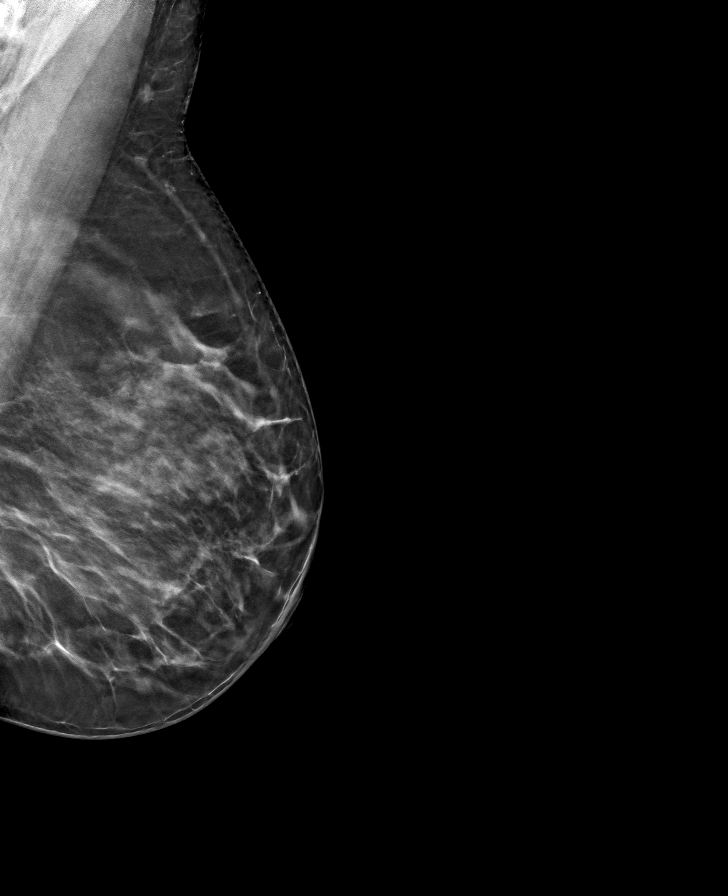

[R CC tomo · tomo slice 33/65.0]
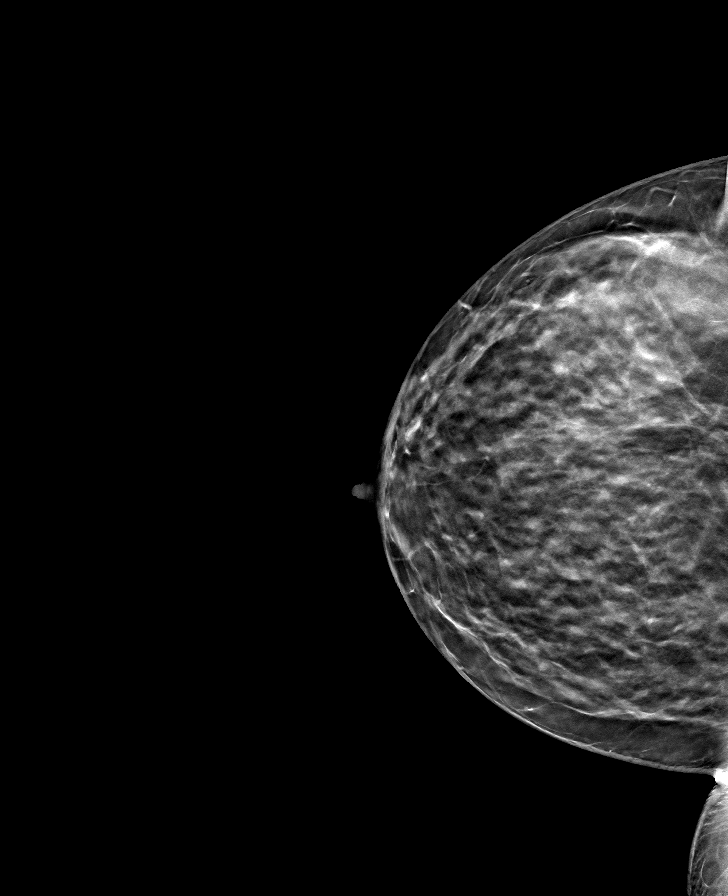

[R MLO tomo · tomo slice 41/80.0]
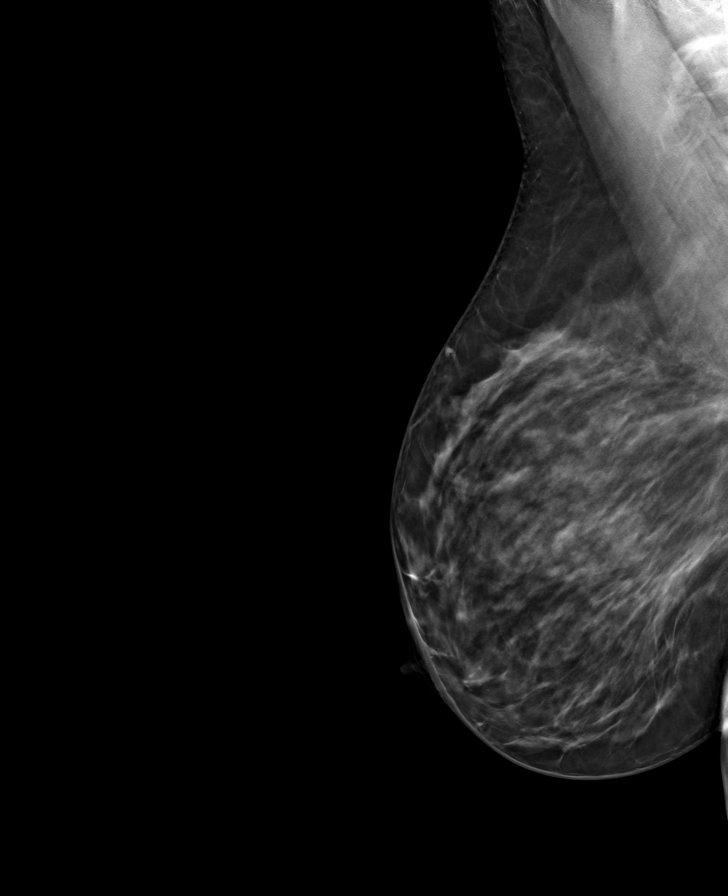

[L CC tomo · tomo slice 35/69.0]
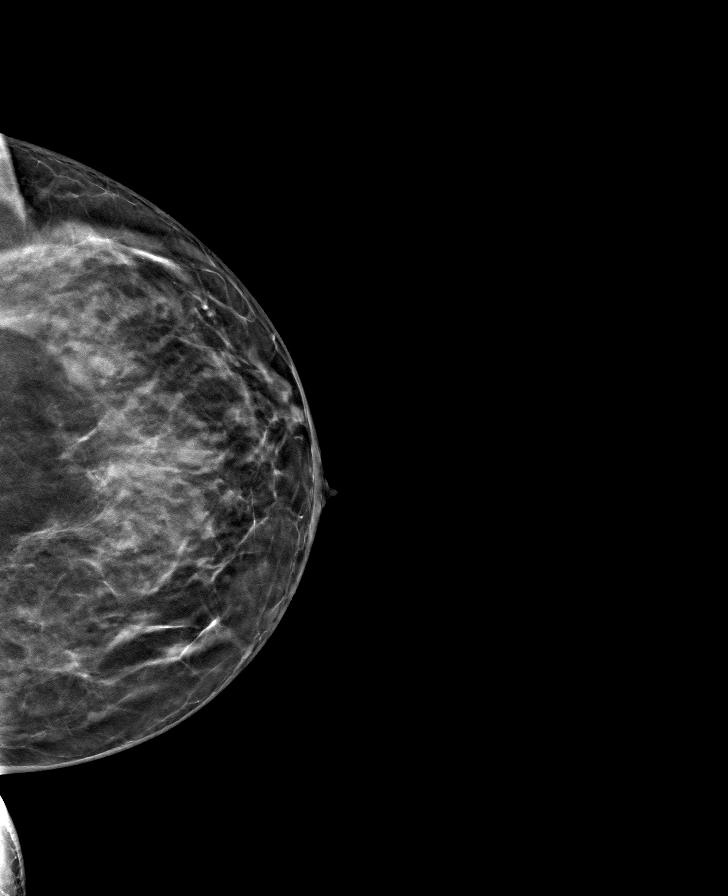

[8 of 24 positions shown; findings below may reference images not displayed]

ACR Breast Density Category c: The breast tissue is heterogeneously
dense, which may obscure small masses.
FINDINGS: No suspicious calcifications, masses or areas of distortion are seen
in the bilateral breasts.

Mammographic images were processed with CAD.

On physical exam, no suspicious palpable masses are identified at 2
o'clock in the right breast on my exam.

Targeted ultrasound is performed, showing normal fibroglandular
tissue in the upper inner quadrant of the right breast. No masses or
suspicious areas of shadowing are identified.
IMPRESSION: 1. There are no suspicious mammographic or targeted sonographic
abnormalities to explain the palpable site of concern identified by
the patient's physician.

2.  No mammographic evidence of malignancy in the bilateral breasts.

RECOMMENDATION:
1. Clinical follow-up recommended for the palpable area of concern
in the right breast. Any further workup should be based on clinical
grounds.

2. Screening mammogram at age 40 unless there are persistent or
intervening clinical concerns. (Code:[id number]-[id number])

I have discussed the findings and recommendations with the patient.
Results were also provided in writing at the conclusion of the
visit. If applicable, a reminder letter will be sent to the patient
regarding the next appointment.

BI-RADS CATEGORY  1: Negative.

## 2019-11-23 DIAGNOSIS — Z23 Encounter for immunization: Secondary | ICD-10-CM | POA: Diagnosis not present

## 2019-12-04 ENCOUNTER — Ambulatory Visit (INDEPENDENT_AMBULATORY_CARE_PROVIDER_SITE_OTHER): Payer: Medicare Other | Admitting: Nurse Practitioner

## 2019-12-04 ENCOUNTER — Other Ambulatory Visit (HOSPITAL_COMMUNITY)
Admission: RE | Admit: 2019-12-04 | Discharge: 2019-12-04 | Disposition: A | Discharge status: Home / Self Care | Payer: Medicare Other | Source: Ambulatory Visit | Attending: Nurse Practitioner | Admitting: Nurse Practitioner

## 2019-12-04 ENCOUNTER — Other Ambulatory Visit: Payer: Self-pay

## 2019-12-04 ENCOUNTER — Encounter: Payer: Self-pay | Admitting: Nurse Practitioner

## 2019-12-04 VITALS — BP 118/80 | HR 75 | Temp 98.1°F | Ht 62.0 in | Wt 160.6 lb

## 2019-12-04 DIAGNOSIS — H548 Legal blindness, as defined in USA: Secondary | ICD-10-CM

## 2019-12-04 DIAGNOSIS — Z113 Encounter for screening for infections with a predominantly sexual mode of transmission: Secondary | ICD-10-CM

## 2019-12-04 DIAGNOSIS — Z0000 Encounter for general adult medical examination without abnormal findings: Secondary | ICD-10-CM

## 2019-12-04 DIAGNOSIS — Z124 Encounter for screening for malignant neoplasm of cervix: Secondary | ICD-10-CM

## 2019-12-04 DIAGNOSIS — I10 Essential (primary) hypertension: Secondary | ICD-10-CM

## 2019-12-04 DIAGNOSIS — E559 Vitamin D deficiency, unspecified: Secondary | ICD-10-CM

## 2019-12-04 LAB — POCT UA - MICROALBUMIN
Creatinine, POC: 300 mg/dL
Microalbumin Ur, POC: 80 mg/L

## 2019-12-04 LAB — POCT URINALYSIS DIPSTICK
Bilirubin, UA: NEGATIVE
Blood, UA: NEGATIVE
Glucose, UA: NEGATIVE
Ketones, UA: NEGATIVE
Leukocytes, UA: NEGATIVE
Nitrite, UA: NEGATIVE
Protein, UA: POSITIVE — AB
Spec Grav, UA: >=1.03 — AB (ref 1.010–1.025)
Urobilinogen, UA: 1 E.U./dL
pH, UA: 6 (ref 5.0–8.0)

## 2019-12-04 NOTE — Progress Notes (Signed)
I,Yamilka [employer] as a Education administrator for [employer], FNP.,have documented all relevant documentation on the behalf of Minette Brine, FNP,as directed by  Minette Brine, FNP while in the presence of Minette Brine, [place]. This visit occurred during the SARS-CoV-2 public health emergency.  Safety protocols were in place, including screening questions prior to the visit, additional usage of staff PPE, and extensive cleaning of exam room while observing appropriate contact time as indicated for disinfecting solutions.  Subjective:     Patient ID: Yesenia Velasquez , female    DOB: December 04, 1980 , 39 y.o.   MRN: [id number]   Chief Complaint  Patient presents with  . Annual Exam    HPI  Here for HM.    Wt Readings from Last 3 Encounters: 12/04/19 : 160 lb 9.6 oz (72.8 kg) 08/13/19 : 162 lb 12.8 oz (73.8 kg) 04/11/19 : 159 lb 9.6 oz (72.4 kg)     Hypertension This is a new problem. The current episode started more than 1 year ago. The problem is unchanged. The problem is controlled. Pertinent negatives include no anxiety, chest pain, headaches or palpitations. There are no associated agents to hypertension. There are no known risk factors for coronary artery disease.     Past Medical History:  Diagnosis Date  . Hypertension      History reviewed. No pertinent family history.   Current Outpatient Medications:  .  aspirin EC 81 MG tablet, Take 81 mg by mouth daily., Disp: , Rfl:  .  hydrochlorothiazide (HYDRODIURIL) 25 MG tablet, Take 1 tablet by mouth once daily, Disp: 90 tablet, Rfl: 0 .  norethindrone-ethinyl estradiol (MICROGESTIN) 1-20 MG-MCG tablet, Take 1 tablet by mouth daily., Disp: 63 tablet, Rfl: 3   No Known Allergies    The patient states she uses OCP (estrogen/progesterone) for birth control. LMP - 11/12/2019.  Negative for Dysmenorrhea and Negative for Menorrhagia. Negative for: breast discharge, breast lump(s), breast pain and breast self exam. Associated symptoms  include abnormal vaginal bleeding. Pertinent negatives include abnormal bleeding (hematology), anxiety, decreased libido, depression, difficulty falling sleep, dyspareunia, history of infertility, nocturia, sexual dysfunction, sleep disturbances, urinary incontinence, urinary urgency, vaginal discharge and vaginal itching. Diet regular.; avoiding sodas. The patient states her exercise level is moderate - 3 days a week at the gym.  She is using weights for strength training.   The patient's tobacco use is:  Social History   Tobacco Use  Smoking Status Never Smoker  Smokeless Tobacco Never Used  . She has been exposed to passive smoke. The patient's alcohol use is:  Social History   Substance and Sexual Activity  Alcohol Use Yes   Comment: SOCIALLY   Additional information: Last pap 11/13/2016 next one scheduled for today.     Review of Systems  Constitutional: Negative.   HENT: Negative.   Eyes: Negative.        Legally blind  Respiratory: Negative.   Cardiovascular: Negative.  Negative for chest pain and palpitations.  Gastrointestinal: Negative.   Endocrine: Negative.   Genitourinary: Negative.   Musculoskeletal: Negative.   Skin: Negative.   Allergic/Immunologic: Negative.   Neurological: Negative.  Negative for dizziness and headaches.  Hematological: Negative.   Psychiatric/Behavioral: Negative.      Today's Vitals   12/04/19 1442  BP: 118/80  Pulse: 75  Temp: 98.1 F (36.7 C)  Weight: 160 lb 9.6 oz (72.8 kg)  Height: 5' 2"  (1.575 m)  PainSc: 0-No pain   Body mass index is 29.37 kg/m.  Objective:  Physical Exam Vitals reviewed.  Constitutional:      Appearance: Normal appearance. She is well-developed.  HENT:     Head: Normocephalic and atraumatic.     Right Ear: Hearing, tympanic membrane, ear canal and external ear normal. There is no impacted cerumen.     Left Ear: Hearing, tympanic membrane, ear canal and external ear normal. There is no impacted  cerumen.  Eyes:     General: Lids are normal.     Extraocular Movements: Extraocular movements intact.     Conjunctiva/sclera: Conjunctivae normal.     Pupils: Pupils are equal, round, and reactive to light.     Funduscopic exam:    Right eye: No papilledema.        Left eye: No papilledema.     Comments: Bilateral eyes with scarring noted to conjunctivae  Neck:     Thyroid: No thyroid mass.     Vascular: No carotid bruit.  Cardiovascular:     Rate and Rhythm: Normal rate and regular rhythm.     Pulses: Normal pulses.     Heart sounds: Normal heart sounds. No murmur heard.   Pulmonary:     Effort: Pulmonary effort is normal.     Breath sounds: Normal breath sounds.  Abdominal:     General: Abdomen is flat. Bowel sounds are normal.     Palpations: Abdomen is soft.  Genitourinary:    Pubic Area: No rash.      Labia:        Right: No rash or tenderness.        Left: No rash or tenderness.      Vagina: Normal.     Cervix: Normal.     Uterus: Normal.      Adnexa: Right adnexa normal and left adnexa normal.       Right: No mass or tenderness.         Left: No mass or tenderness.    Musculoskeletal:        General: No swelling. Normal range of motion.     Cervical back: Full passive range of motion without pain, normal range of motion and neck supple.     Right lower leg: No edema.     Left lower leg: No edema.  Skin:    General: Skin is warm and dry.     Capillary Refill: Capillary refill takes less than 2 seconds.  Neurological:     General: No focal deficit present.     Mental Status: She is alert and oriented to person, place, and time.     Cranial Nerves: No cranial nerve deficit.     Sensory: No sensory deficit.  Psychiatric:        Mood and Affect: Mood normal.        Behavior: Behavior normal.        Thought Content: Thought content normal.        Judgment: Judgment normal.         Assessment And Plan:     1. Encounter for general adult medical examination  w/o abnormal findings . Behavior modifications discussed and diet history reviewed.   . Pt will continue to exercise regularly and modify diet with low GI, plant based foods and decrease intake of processed foods.  . Recommend intake of daily multivitamin, Vitamin D, and calcium.  . Recommend for preventive screenings, as well as recommend immunizations that include influenza, TDAP (up to date) . Breast exam done - normal findings -  CBC  2. Screening for STD (sexually transmitted disease) - Cervicovaginal ancillary only  3. Encounter for Papanicolaou smear of cervix  PAP done no abnormal findings - Cytology - PAP  4. Essential hypertension  Chronic, she is well controlled  Continue with current medications - POCT Urinalysis Dipstick (81002) - POCT UA - Microalbumin - EKG 12-Lead - CMP14+EGFR  5. Vitamin D deficiency  Will check vitamin D level and supplement as needed.     Also encouraged to spend 15 minutes in the sun daily.   6. Legally blind  She is able to get around on her own, does not drive     Patient was given opportunity to ask questions. Patient verbalized understanding of the plan and was able to repeat key elements of the plan. All questions were answered to their satisfaction.    Teola Bradley, FNP, have reviewed all documentation for this visit. The documentation on 12/18/19 for the exam, diagnosis, procedures, and orders are all accurate and complete.  THE PATIENT IS ENCOURAGED TO PRACTICE SOCIAL DISTANCING DUE TO THE COVID-19 PANDEMIC.

## 2019-12-05 LAB — CMP14+EGFR
ALT: 22 IU/L (ref 0–32)
AST: 22 IU/L (ref 0–40)
Albumin/Globulin Ratio: 1.7 (ref 1.2–2.2)
Albumin: 4.7 g/dL (ref 3.8–4.8)
Alkaline Phosphatase: 92 IU/L (ref 44–121)
BUN/Creatinine Ratio: 10 (ref 9–23)
BUN: 10 mg/dL (ref 6–20)
Bilirubin Total: 0.2 mg/dL (ref 0.0–1.2)
CO2: 23 mmol/L (ref 20–29)
Calcium: 9.6 mg/dL (ref 8.7–10.2)
Chloride: 103 mmol/L (ref 96–106)
Creatinine, Ser: 0.98 mg/dL (ref 0.57–1.00)
GFR calc Af Amer: 84 mL/min/{1.73_m2} (ref >59)
GFR calc non Af Amer: 73 mL/min/{1.73_m2} (ref >59)
Globulin, Total: 2.8 g/dL (ref 1.5–4.5)
Glucose: 80 mg/dL (ref 65–99)
Potassium: 3.9 mmol/L (ref 3.5–5.2)
Sodium: 141 mmol/L (ref 134–144)
Total Protein: 7.5 g/dL (ref 6.0–8.5)

## 2019-12-05 LAB — CBC
Hematocrit: 39.4 % (ref 34.0–46.6)
Hemoglobin: 13 g/dL (ref 11.1–15.9)
MCH: 28.7 pg (ref 26.6–33.0)
MCHC: 33 g/dL (ref 31.5–35.7)
MCV: 87 fL (ref 79–97)
Platelets: 253 10*3/uL (ref 150–450)
RBC: 4.53 x10E6/uL (ref 3.77–5.28)
RDW: 15.4 % (ref 11.7–15.4)
WBC: 7.2 10*3/uL (ref 3.4–10.8)

## 2019-12-10 LAB — MOLECULAR ANCILLARY ONLY
Bacterial Vaginitis (gardnerella): NEGATIVE
Candida Glabrata: NEGATIVE
Candida Vaginitis: NEGATIVE
Chlamydia: NEGATIVE
Comment: NEGATIVE
Comment: NEGATIVE
Comment: NEGATIVE
Comment: NEGATIVE
Comment: NEGATIVE
Comment: NORMAL
Neisseria Gonorrhea: NEGATIVE
Trichomonas: NEGATIVE

## 2019-12-10 LAB — CYTOLOGY - PAP
Adequacy: ABSENT
Diagnosis: NEGATIVE

## 2019-12-18 NOTE — Patient Instructions (Signed)
Health Maintenance, Female Adopting a healthy lifestyle and getting preventive care are important in promoting health and wellness. Ask your health care provider about:  The right schedule for you to have regular tests and exams.  Things you can do on your own to prevent diseases and keep yourself healthy. What should I know about diet, weight, and exercise? Eat a healthy diet   Eat a diet that includes plenty of vegetables, fruits, low-fat dairy products, and lean protein.  Do not eat a lot of foods that are high in solid fats, added sugars, or sodium. Maintain a healthy weight Body mass index (BMI) is used to identify weight problems. It estimates body fat based on height and weight. Your health care provider can help determine your BMI and help you achieve or maintain a healthy weight. Get regular exercise Get regular exercise. This is one of the most important things you can do for your health. Most adults should:  Exercise for at least 150 minutes each week. The exercise should increase your heart rate and make you sweat (moderate-intensity exercise).  Do strengthening exercises at least twice a week. This is in addition to the moderate-intensity exercise.  Spend less time sitting. Even light physical activity can be beneficial. Watch cholesterol and blood lipids Have your blood tested for lipids and cholesterol at 39 years of age, then have this test every 5 years. Have your cholesterol levels checked more often if:  Your lipid or cholesterol levels are high.  You are older than 40 years of age.  You are at high risk for heart disease. What should I know about cancer screening? Depending on your health history and family history, you may need to have cancer screening at various ages. This may include screening for:  Breast cancer.  Cervical cancer.  Colorectal cancer.  Skin cancer.  Lung cancer. What should I know about heart disease, diabetes, and high blood  pressure? Blood pressure and heart disease  High blood pressure causes heart disease and increases the risk of stroke. This is more likely to develop in people who have high blood pressure readings, are of African descent, or are overweight.  Have your blood pressure checked: ? Every 3-5 years if you are 18-39 years of age. ? Every year if you are 40 years old or older. Diabetes Have regular diabetes screenings. This checks your fasting blood sugar level. Have the screening done:  Once every three years after age 40 if you are at a normal weight and have a low risk for diabetes.  More often and at a younger age if you are overweight or have a high risk for diabetes. What should I know about preventing infection? Hepatitis B If you have a higher risk for hepatitis B, you should be screened for this virus. Talk with your health care provider to find out if you are at risk for hepatitis B infection. Hepatitis C Testing is recommended for:  Everyone born from 1945 through 1965.  Anyone with known risk factors for hepatitis C. Sexually transmitted infections (STIs)  Get screened for STIs, including gonorrhea and chlamydia, if: ? You are sexually active and are younger than 39 years of age. ? You are older than 39 years of age and your health care provider tells you that you are at risk for this type of infection. ? Your sexual activity has changed since you were last screened, and you are at increased risk for chlamydia or gonorrhea. Ask your health care provider if   you are at risk.  Ask your health care provider about whether you are at high risk for HIV. Your health care provider may recommend a prescription medicine to help prevent HIV infection. If you choose to take medicine to prevent HIV, you should first get tested for HIV. You should then be tested every 3 months for as long as you are taking the medicine. Pregnancy  If you are about to stop having your period (premenopausal) and  you may become pregnant, seek counseling before you get pregnant.  Take 400 to 800 micrograms (mcg) of folic acid every day if you become pregnant.  Ask for birth control (contraception) if you want to prevent pregnancy. Osteoporosis and menopause Osteoporosis is a disease in which the bones lose minerals and strength with aging. This can result in bone fractures. If you are 65 years old or older, or if you are at risk for osteoporosis and fractures, ask your health care provider if you should:  Be screened for bone loss.  Take a calcium or vitamin D supplement to lower your risk of fractures.  Be given hormone replacement therapy (HRT) to treat symptoms of menopause. Follow these instructions at home: Lifestyle  Do not use any products that contain nicotine or tobacco, such as cigarettes, e-cigarettes, and chewing tobacco. If you need help quitting, ask your health care provider.  Do not use street drugs.  Do not share needles.  Ask your health care provider for help if you need support or information about quitting drugs. Alcohol use  Do not drink alcohol if: ? Your health care provider tells you not to drink. ? You are pregnant, may be pregnant, or are planning to become pregnant.  If you drink alcohol: ? Limit how much you use to 0-1 drink a day. ? Limit intake if you are breastfeeding.  Be aware of how much alcohol is in your drink. In the [place] one drink equals one 12 oz bottle of beer (355 mL), one 5 oz glass of wine (148 mL), or one 1 oz glass of hard liquor (44 mL). General instructions  Schedule regular health, dental, and eye exams.  Stay current with your vaccines.  Tell your health care provider if: ? You often feel depressed. ? You have ever been abused or do not feel safe at home. Summary  Adopting a healthy lifestyle and getting preventive care are important in promoting health and wellness.  Follow your health care provider's instructions about healthy  diet, exercising, and getting tested or screened for diseases.  Follow your health care provider's instructions on monitoring your cholesterol and blood pressure. This information is not intended to replace advice given to you by your health care provider. Make sure you discuss any questions you have with your health care provider. Document Revised: 01/04/2018 Document Reviewed: 01/04/2018 Elsevier Patient Education  2020 Elsevier Inc[employer]  

## 2020-01-03 ENCOUNTER — Encounter: Payer: Self-pay | Admitting: Nurse Practitioner

## 2020-01-03 ENCOUNTER — Other Ambulatory Visit: Payer: Self-pay | Admitting: Nurse Practitioner

## 2020-01-03 DIAGNOSIS — Z3041 Encounter for surveillance of contraceptive pills: Secondary | ICD-10-CM

## 2020-01-04 ENCOUNTER — Other Ambulatory Visit: Payer: Self-pay

## 2020-01-28 DIAGNOSIS — Z20828 Contact with and (suspected) exposure to other viral communicable diseases: Secondary | ICD-10-CM | POA: Diagnosis not present

## 2020-01-29 DIAGNOSIS — Z20828 Contact with and (suspected) exposure to other viral communicable diseases: Secondary | ICD-10-CM | POA: Diagnosis not present

## 2020-03-16 ENCOUNTER — Other Ambulatory Visit: Payer: Self-pay | Admitting: Nurse Practitioner

## 2020-03-16 DIAGNOSIS — Z3041 Encounter for surveillance of contraceptive pills: Secondary | ICD-10-CM

## 2020-04-15 DIAGNOSIS — H35342 Macular cyst, hole, or pseudohole, left eye: Secondary | ICD-10-CM | POA: Diagnosis not present

## 2020-04-15 DIAGNOSIS — H33002 Unspecified retinal detachment with retinal break, left eye: Secondary | ICD-10-CM | POA: Diagnosis not present

## 2020-04-15 DIAGNOSIS — Q141 Congenital malformation of retina: Secondary | ICD-10-CM | POA: Diagnosis not present

## 2020-04-16 ENCOUNTER — Encounter: Payer: Self-pay | Admitting: Nurse Practitioner

## 2020-04-16 ENCOUNTER — Ambulatory Visit (INDEPENDENT_AMBULATORY_CARE_PROVIDER_SITE_OTHER): Payer: Medicare Other | Admitting: Nurse Practitioner

## 2020-04-16 ENCOUNTER — Ambulatory Visit (INDEPENDENT_AMBULATORY_CARE_PROVIDER_SITE_OTHER): Payer: Medicare Other

## 2020-04-16 ENCOUNTER — Other Ambulatory Visit: Payer: Self-pay

## 2020-04-16 VITALS — BP 130/60 | HR 91 | Temp 98.2°F | Ht 62.6 in | Wt 169.1 lb

## 2020-04-16 VITALS — BP 130/60 | HR 91 | Temp 98.2°F | Ht 62.6 in | Wt 169.2 lb

## 2020-04-16 DIAGNOSIS — Z0000 Encounter for general adult medical examination without abnormal findings: Secondary | ICD-10-CM

## 2020-04-16 DIAGNOSIS — I10 Essential (primary) hypertension: Secondary | ICD-10-CM | POA: Diagnosis not present

## 2020-04-16 DIAGNOSIS — Z1231 Encounter for screening mammogram for malignant neoplasm of breast: Secondary | ICD-10-CM | POA: Diagnosis not present

## 2020-04-16 DIAGNOSIS — R8290 Unspecified abnormal findings in urine: Secondary | ICD-10-CM

## 2020-04-16 DIAGNOSIS — E559 Vitamin D deficiency, unspecified: Secondary | ICD-10-CM | POA: Diagnosis not present

## 2020-04-16 LAB — POCT URINALYSIS DIPSTICK
Bilirubin, UA: NEGATIVE
Blood, UA: NEGATIVE
Glucose, UA: NEGATIVE
Ketones, UA: NEGATIVE
Leukocytes, UA: NEGATIVE
Nitrite, UA: NEGATIVE
Protein, UA: NEGATIVE
Spec Grav, UA: 1.02 (ref 1.010–1.025)
Urobilinogen, UA: 1 E.U./dL
pH, UA: 7 (ref 5.0–8.0)

## 2020-04-16 NOTE — Patient Instructions (Signed)
Ms. Yesenia Velasquez , Thank you for taking time to come for your Medicare Wellness Visit. I appreciate your ongoing commitment to your health goals. Please review the following plan we discussed and let me know if I can assist you in the future.   Screening recommendations/referrals: Colonoscopy: n/a Mammogram: ordered today Bone Density: n/a Recommended yearly ophthalmology/optometry visit for glaucoma screening and checkup Recommended yearly dental visit for hygiene and checkup  Vaccinations: Influenza vaccine: completed 11/16/2019, due 08/25/2020 Pneumococcal vaccine: n/a Tdap vaccine: completed 11/13/2013, due 11/14/2023 Shingles vaccine: n/a  Covid-19:  11/23/2019, 05/04/2019, 04/14/2019  Advanced directives: Advance directive discussed with you today. Even though you declined this today please call our office should you change your mind and we can give you the proper paperwork for you to fill out.  Conditions/risks identified: none  Next appointment: Follow up in one year for your annual wellness visit.   Preventive Care 40-64 Years, Female Preventive care refers to lifestyle choices and visits with your health care provider that can promote health and wellness. What does preventive care include?  A yearly physical exam. This is also called an annual well check.  Dental exams once or twice a year.  Routine eye exams. Ask your health care provider how often you should have your eyes checked.  Personal lifestyle choices, including:  Daily care of your teeth and gums.  Regular physical activity.  Eating a healthy diet.  Avoiding tobacco and drug use.  Limiting alcohol use.  Practicing safe sex.  Taking low-dose aspirin daily starting at age 69.  Taking vitamin and mineral supplements as recommended by your health care provider. What happens during an annual well check? The services and screenings done by your health care provider during your annual well check will depend on  your age, overall health, lifestyle risk factors, and family history of disease. Counseling  Your health care provider may ask you questions about your:  Alcohol use.  Tobacco use.  Drug use.  Emotional well-being.  Home and relationship well-being.  Sexual activity.  Eating habits.  Work and work Statistician.  Method of birth control.  Menstrual cycle.  Pregnancy history. Screening  You may have the following tests or measurements:  Height, weight, and BMI.  Blood pressure.  Lipid and cholesterol levels. These may be checked every 5 years, or more frequently if you are over 21 years old.  Skin check.  Lung cancer screening. You may have this screening every year starting at age 16 if you have a 30-pack-year history of smoking and currently smoke or have quit within the past 15 years.  Fecal occult blood test (FOBT) of the stool. You may have this test every year starting at age 24.  Flexible sigmoidoscopy or colonoscopy. You may have a sigmoidoscopy every 5 years or a colonoscopy every 10 years starting at age 39.  Hepatitis C blood test.  Hepatitis B blood test.  Sexually transmitted disease (STD) testing.  Diabetes screening. This is done by checking your blood sugar (glucose) after you have not eaten for a while (fasting). You may have this done every 1-3 years.  Mammogram. This may be done every 1-2 years. Talk to your health care provider about when you should start having regular mammograms. This may depend on whether you have a family history of breast cancer.  BRCA-related cancer screening. This may be done if you have a family history of breast, ovarian, tubal, or peritoneal cancers.  Pelvic exam and Pap test. This may be done  every 3 years starting at age 30. Starting at age 42, this may be done every 5 years if you have a Pap test in combination with an HPV test.  Bone density scan. This is done to screen for osteoporosis. You may have this scan if  you are at high risk for osteoporosis. Discuss your test results, treatment options, and if necessary, the need for more tests with your health care provider. Vaccines  Your health care provider may recommend certain vaccines, such as:  Influenza vaccine. This is recommended every year.  Tetanus, diphtheria, and acellular pertussis (Tdap, Td) vaccine. You may need a Td booster every 10 years.  Zoster vaccine. You may need this after age 43.  Pneumococcal 13-valent conjugate (PCV13) vaccine. You may need this if you have certain conditions and were not previously vaccinated.  Pneumococcal polysaccharide (PPSV23) vaccine. You may need one or two doses if you smoke cigarettes or if you have certain conditions. Talk to your health care provider about which screenings and vaccines you need and how often you need them. This information is not intended to replace advice given to you by your health care provider. Make sure you discuss any questions you have with your health care provider. Document Released: 02/07/2015 Document Revised: 10/01/2015 Document Reviewed: 11/12/2014 Elsevier Interactive Patient Education  2017 [employer] Prevention in the Home Falls can cause injuries. They can happen to people of all ages. There are many things you can do to make your home safe and to help prevent falls. What can I do on the outside of my home?  Regularly fix the edges of walkways and driveways and fix any cracks.  Remove anything that might make you trip as you walk through a door, such as a raised step or threshold.  Trim any bushes or trees on the path to your home.  Use bright outdoor lighting.  Clear any walking paths of anything that might make someone trip, such as rocks or tools.  Regularly check to see if handrails are loose or broken. Make sure that both sides of any steps have handrails.  Any raised decks and porches should have guardrails on the edges.  Have any  leaves, snow, or ice cleared regularly.  Use sand or salt on walking paths during winter.  Clean up any spills in your garage right away. This includes oil or grease spills. What can I do in the bathroom?  Use night lights.  Install grab bars by the toilet and in the tub and shower. Do not use towel bars as grab bars.  Use non-skid mats or decals in the tub or shower.  If you need to sit down in the shower, use a plastic, non-slip stool.  Keep the floor dry. Clean up any water that spills on the floor as soon as it happens.  Remove soap buildup in the tub or shower regularly.  Attach bath mats securely with double-sided non-slip rug tape.  Do not have throw rugs and other things on the floor that can make you trip. What can I do in the bedroom?  Use night lights.  Make sure that you have a light by your bed that is easy to reach.  Do not use any sheets or blankets that are too big for your bed. They should not hang down onto the floor.  Have a firm chair that has side arms. You can use this for support while you get dressed.  Do not have  throw rugs and other things on the floor that can make you trip. What can I do in the kitchen?  Clean up any spills right away.  Avoid walking on wet floors.  Keep items that you use a lot in easy-to-reach places.  If you need to reach something above you, use a strong step stool that has a grab bar.  Keep electrical cords out of the way.  Do not use floor polish or wax that makes floors slippery. If you must use wax, use non-skid floor wax.  Do not have throw rugs and other things on the floor that can make you trip. What can I do with my stairs?  Do not leave any items on the stairs.  Make sure that there are handrails on both sides of the stairs and use them. Fix handrails that are broken or loose. Make sure that handrails are as long as the stairways.  Check any carpeting to make sure that it is firmly attached to the stairs.  Fix any carpet that is loose or worn.  Avoid having throw rugs at the top or bottom of the stairs. If you do have throw rugs, attach them to the floor with carpet tape.  Make sure that you have a light switch at the top of the stairs and the bottom of the stairs. If you do not have them, ask someone to add them for you. What else can I do to help prevent falls?  Wear shoes that:  Do not have high heels.  Have rubber bottoms.  Are comfortable and fit you well.  Are closed at the toe. Do not wear sandals.  If you use a stepladder:  Make sure that it is fully opened. Do not climb a closed stepladder.  Make sure that both sides of the stepladder are locked into place.  Ask someone to hold it for you, if possible.  Clearly mark and make sure that you can see:  Any grab bars or handrails.  First and last steps.  Where the edge of each step is.  Use tools that help you move around (mobility aids) if they are needed. These include:  Canes.  Walkers.  Scooters.  Crutches.  Turn on the lights when you go into a dark area. Replace any light bulbs as soon as they burn out.  Set up your furniture so you have a clear path. Avoid moving your furniture around.  If any of your floors are uneven, fix them.  If there are any pets around you, be aware of where they are.  Review your medicines with your doctor. Some medicines can make you feel dizzy. This can increase your chance of falling. Ask your doctor what other things that you can do to help prevent falls. This information is not intended to replace advice given to you by your health care provider. Make sure you discuss any questions you have with your health care provider. Document Released: 11/07/2008 Document Revised: 06/19/2015 Document Reviewed: 02/15/2014 Elsevier Interactive Patient Education  2017 [employer].

## 2020-04-16 NOTE — Progress Notes (Signed)
This visit occurred during the SARS-CoV-2 public health emergency.  Safety protocols were in place, including screening questions prior to the visit, additional usage of staff PPE, and extensive cleaning of exam room while observing appropriate contact time as indicated for disinfecting solutions.  Subjective:   Yesenia Velasquez is a 40 y.o. female who presents for Medicare Annual (Subsequent) preventive examination.  Review of Systems     Cardiac Risk Factors include: hypertension;obesity (BMI >30kg/m2);sedentary lifestyle     Objective:    Today's Vitals   04/16/20 1503  BP: 130/60  Pulse: 91  Temp: 98.2 F (36.8 C)  TempSrc: Oral  SpO2: 99%  Weight: 169 lb 3.2 oz (76.7 kg)  Height: 5' 2.6" (1.59 m)   Body mass index is 30.36 kg/m.  Advanced Directives 04/16/2020 04/11/2019 04/06/2018  Does Patient Have a Medical Advance Directive? No No No  Would patient like information on creating a medical advance directive? - - Yes (MAU/Ambulatory/Procedural Areas - Information given)    Current Medications (verified) Outpatient Encounter Medications as of 04/16/2020  Medication Sig  . aspirin EC 81 MG tablet Take 81 mg by mouth daily.  . hydrochlorothiazide (HYDRODIURIL) 25 MG tablet Take 1 tablet by mouth once daily  . MICROGESTIN 1-20 MG-MCG tablet Take 1 tablet by mouth once daily   No facility-administered encounter medications on file as of 04/16/2020.    Allergies (verified) Patient has no known allergies.   History: Past Medical History:  Diagnosis Date  . Hypertension    History reviewed. No pertinent surgical history. History reviewed. No pertinent family history. Social History   Socioeconomic History  . Marital status: Single    Spouse name: Not on file  . Number of children: Not on file  . Years of education: Not on file  . Highest education level: Not on file  Occupational History  . Not on file  Tobacco Use  . Smoking status: Never Smoker  . Smokeless  tobacco: Never Used  Vaping Use  . Vaping Use: Never used  Substance and Sexual Activity  . Alcohol use: Yes    Comment: SOCIALLY  . Drug use: Not Currently  . Sexual activity: Not Currently  Other Topics Concern  . Not on file  Social History Narrative  . Not on file   Social Determinants of Health   Financial Resource Strain: Low Risk   . Difficulty of Paying Living Expenses: Not hard at all  Food Insecurity: No Food Insecurity  . Worried About Charity fundraiser in the Last Year: Never true  . Ran Out of Food in the Last Year: Never true  Transportation Needs: No Transportation Needs  . Lack of Transportation (Medical): No  . Lack of Transportation (Non-Medical): No  Physical Activity: Inactive  . Days of Exercise per Week: 0 days  . Minutes of Exercise per Session: 0 min  Stress: No Stress Concern Present  . Feeling of Stress : Not at all  Social Connections: Not on file    Tobacco Counseling Counseling given: Not Answered   Clinical Intake:  Pre-visit preparation completed: Yes  Pain : No/denies pain     Nutritional Status: BMI > 30  Obese Nutritional Risks: None Diabetes: No  How often do you need to have someone help you when you read instructions, pamphlets, or other written materials from your doctor or pharmacy?: 1 - Never What is the last grade level you completed in school?: 12th grade  Diabetic? no  Interpreter Needed?: No  Information entered by :: NAllen LPN   Activities of Daily Living In your present state of health, do you have any difficulty performing the following activities: 04/16/2020  Hearing? N  Vision? N  Difficulty concentrating or making decisions? N  Walking or climbing stairs? N  Dressing or bathing? N  Doing errands, shopping? N  Preparing Food and eating ? N  Using the Toilet? N  In the past six months, have you accidently leaked urine? N  Do you have problems with loss of bowel control? N  Managing your Medications?  N  Managing your Finances? N  Housekeeping or managing your Housekeeping? N  Some recent data might be hidden    Patient Care Team: Minette Brine, FNP as PCP - General (General Practice)  Indicate any recent Medical Services you may have received from other than [facility] providers in the past year (date may be approximate).     Assessment:   This is a routine wellness examination for [place].  Hearing/Vision screen No exam data present  Dietary issues and exercise activities discussed: Current Exercise Habits: The patient does not participate in regular exercise at present  Goals    .  Exercise 150 min/wk Moderate Activity (pt-stated)      Wants to exercise more to lose stomach    .  Patient Stated      04/11/2019, so to maintain weight loss and keep exercising    .  Patient Stated      04/16/2020, maintain weight      Depression Screen PHQ 2/9 Scores 04/16/2020 04/11/2019 11/28/2018 09/11/2018 06/20/2018 04/06/2018 11/18/2017  PHQ - 2 Score 0 0 0 0 0 0 0  PHQ- 9 Score - 0 - - - 0 -    Fall Risk Fall Risk  04/16/2020 04/11/2019 11/28/2018 09/11/2018 06/20/2018  Falls in the past year? 0 0 0 0 0  Risk for fall due to : Impaired vision;Medication side effect - - - -  Follow up Falls evaluation completed;Education provided;Falls prevention discussed Falls evaluation completed;Education provided;Falls prevention discussed - - -    FALL RISK PREVENTION PERTAINING TO THE HOME:  Any stairs in or around the home? Yes  If so, are there any without handrails? No  Home free of loose throw rugs in walkways, pet beds, electrical cords, etc? Yes  Adequate lighting in your home to reduce risk of falls? Yes   ASSISTIVE DEVICES UTILIZED TO PREVENT FALLS:  Life alert? No  Use of a cane, walker or w/c? No  Grab bars in the bathroom? No  Shower chair or bench in shower? Yes  Elevated toilet seat or a handicapped toilet? No   TIMED UP AND GO:  Was the test performed? No .    Gait steady and  fast without use of assistive device  Cognitive Function:     6CIT Screen 04/16/2020 04/11/2019 04/06/2018  What Year? 0 points 0 points 0 points  What month? 0 points 0 points 0 points  What time? 0 points 0 points 0 points  Count back from 20 0 points 0 points 0 points  Months in reverse 0 points 0 points 0 points  Repeat phrase 0 points 0 points 0 points  Total Score 0 0 0    Immunizations Immunization History  Administered Date(s) Administered  . Influenza Whole 09/26/2015  . Influenza-Unspecified 10/22/2016, 11/07/2017, 11/01/2018, 11/16/2019  . PFIZER(Purple Top)SARS-COV-2 Vaccination 04/14/2019, 05/04/2019, 11/23/2019    TDAP status: Up to date  Flu Vaccine status: Up to  date  Pneumococcal vaccine status: Up to date  Covid-19 vaccine status: Completed vaccines  Qualifies for Shingles Vaccine? No   Zostavax completed n/a  Shingrix Completed?: n/a  Screening Tests Health Maintenance  Topic Date Due  . PAP SMEAR-Modifier  12/04/2022  . TETANUS/TDAP  11/14/2023  . INFLUENZA VACCINE  Completed  . COVID-19 Vaccine  Completed  . Hepatitis C Screening  Completed  . HIV Screening  Completed  . HPV VACCINES  Aged Out    Health Maintenance  There are no preventive care reminders to display for this patient.  Colorectal cancer screening: No longer required.   Mammogram status: Ordered today. Pt provided with contact info and advised to call to schedule appt.   Bone Density status: n/a  Lung Cancer Screening: (Low Dose CT Chest recommended if Age 67-80 years, 30 pack-year currently smoking OR have quit w/in 15years.) does not qualify.   Lung Cancer Screening Referral: no  Additional Screening:  Hepatitis C Screening: does qualify; Completed 08/13/2019  Vision Screening: Recommended annual ophthalmology exams for early detection of glaucoma and other disorders of the eye. Is the patient up to date with their annual eye exam?  Yes  Who is the provider or what is  the name of the office in which the patient attends annual eye exams? [facility] If pt is not established with a provider, would they like to be referred to a provider to establish care? No .   Dental Screening: Recommended annual dental exams for proper oral hygiene  Community Resource Referral / Chronic Care Management: CRR required this visit?  No   CCM required this visit?  No      Plan:     I have personally reviewed and noted the following in the patient's chart:   . Medical and social history . Use of alcohol, tobacco or illicit drugs  . Current medications and supplements . Functional ability and status . Nutritional status . Physical activity . Advanced directives . List of other physicians . Hospitalizations, surgeries, and ER visits in previous 12 months . Vitals . Screenings to include cognitive, depression, and falls . Referrals and appointments  In addition, I have reviewed and discussed with patient certain preventive protocols, quality metrics, and best practice recommendations. A written personalized care plan for preventive services as well as general preventive health recommendations were provided to patient.     Kellie Simmering, LPN   04/23/760   Nurse Notes:

## 2020-04-16 NOTE — Progress Notes (Signed)
This visit occurred during the SARS-CoV-2 public health emergency.  Safety protocols were in place, including screening questions prior to the visit, additional usage of staff PPE, and extensive cleaning of exam room while observing appropriate contact time as indicated for disinfecting solutions.  Subjective:     Patient ID: Yesenia Velasquez , female    DOB: 12/16/1980 , 40 y.o.   MRN: [id number]   Chief Complaint  Patient presents with  . Hypertension     HPI  Presents for follow up for her HTN. She is compliant with medications and reports no side effects. She is watching her diet and is exercising at the gym three days per week. She has no complaints currently and doesn't need any refills in her medication. We will check her labs today.  Wt Readings from Last 3 Encounters: 08/13/19 : 162 lb 12.8 oz (73.8 kg) 04/11/19 : 159 lb 9.6 oz (72.4 kg) 04/11/19 : 159 lb (72.1 kg)   Hypertension This is a chronic problem. The current episode started more than 1 year ago. The problem has been gradually improving since onset. The problem is controlled. Pertinent negatives include no chest pain, headaches or palpitations. Agents associated with hypertension include oral contraceptives. There are no known risk factors for coronary artery disease. Past treatments include diuretics. The current treatment provides moderate improvement. There are no compliance problems.      Past Medical History:  Diagnosis Date  . Hypertension      History reviewed. No pertinent family history.   Current Outpatient Medications:  .  aspirin EC 81 MG tablet, Take 81 mg by mouth daily., Disp: , Rfl:  .  hydrochlorothiazide (HYDRODIURIL) 25 MG tablet, Take 1 tablet by mouth once daily, Disp: 90 tablet, Rfl: 0 .  MICROGESTIN 1-20 MG-MCG tablet, Take 1 tablet by mouth once daily, Disp: 63 tablet, Rfl: 0   No Known Allergies   Review of Systems  Constitutional: Negative.   Eyes:       Legally blind   Respiratory: Negative.   Cardiovascular: Negative for chest pain, palpitations and leg swelling.  Genitourinary: Positive for frequency. Negative for dysuria, flank pain, urgency and vaginal pain.       Urine odor  Neurological: Negative for dizziness and headaches.  Psychiatric/Behavioral: Negative.      Today's Vitals   04/16/20 1530  BP: 130/60   There is no height or weight on file to calculate BMI.   Objective:  Physical Exam Constitutional:      General: She is not in acute distress.    Appearance: Normal appearance. She is obese.  Cardiovascular:     Rate and Rhythm: Normal rate and regular rhythm.     Pulses: Normal pulses.     Heart sounds: Normal heart sounds. No murmur heard.   Pulmonary:     Effort: Pulmonary effort is normal. No respiratory distress.     Breath sounds: Normal breath sounds. No wheezing.  Chest:     Chest wall: No tenderness.  Skin:    General: Skin is warm and dry.     Capillary Refill: Capillary refill takes less than 2 seconds.  Neurological:     Mental Status: She is alert and oriented to person, place, and time.  Psychiatric:        Mood and Affect: Mood normal.        Behavior: Behavior normal.        Thought Content: Thought content normal.  Judgment: Judgment normal.         Assessment And Plan:     1. Essential hypertension  Chronic, blood pressure is fairly controlled  Continue with current medications - CMP14+EGFR  2. Vitamin D deficiency  Will check vitamin D level and supplement as needed.     Also encouraged to spend 15 minutes in the sun daily.  - VITAMIN D 25 Hydroxy (Vit-D Deficiency, Fractures)  3. Abnormal urine odor  Urinalysis is normal  Encouraged to stay well hydrated with water. - POCT Urinalysis Dipstick (51982)     Patient was given opportunity to ask questions. Patient verbalized understanding of the plan and was able to repeat key elements of the plan. All questions were answered to  their satisfaction.  Minette Brine, FNP   I, Minette Brine, FNP, have reviewed all documentation for this visit. The documentation on 04/20/20 for the exam, diagnosis, procedures, and orders are all accurate and complete.   IF YOU HAVE BEEN REFERRED TO A SPECIALIST, IT MAY TAKE 1-2 WEEKS TO SCHEDULE/PROCESS THE REFERRAL. IF YOU HAVE NOT HEARD FROM US/SPECIALIST IN TWO WEEKS, PLEASE GIVE [place] A CALL AT [phone].   THE PATIENT IS ENCOURAGED TO PRACTICE SOCIAL DISTANCING DUE TO THE COVID-19 PANDEMIC.

## 2020-05-12 ENCOUNTER — Other Ambulatory Visit: Payer: Medicare Other

## 2020-05-12 ENCOUNTER — Other Ambulatory Visit: Payer: Self-pay

## 2020-05-13 LAB — VITAMIN D 25 HYDROXY (VIT D DEFICIENCY, FRACTURES): Vit D, 25-Hydroxy: 41 ng/mL (ref 30.0–100.0)

## 2020-05-13 LAB — CMP14+EGFR
ALT: 13 IU/L (ref 0–32)
AST: 16 IU/L (ref 0–40)
Albumin/Globulin Ratio: 1.7 (ref 1.2–2.2)
Albumin: 4.4 g/dL (ref 3.8–4.8)
Alkaline Phosphatase: 67 IU/L (ref 44–121)
BUN/Creatinine Ratio: 11 (ref 9–23)
BUN: 10 mg/dL (ref 6–24)
Bilirubin Total: 0.2 mg/dL (ref 0.0–1.2)
CO2: 21 mmol/L (ref 20–29)
Calcium: 9.4 mg/dL (ref 8.7–10.2)
Chloride: 103 mmol/L (ref 96–106)
Creatinine, Ser: 0.87 mg/dL (ref 0.57–1.00)
Globulin, Total: 2.6 g/dL (ref 1.5–4.5)
Glucose: 84 mg/dL (ref 65–99)
Potassium: 4.4 mmol/L (ref 3.5–5.2)
Sodium: 138 mmol/L (ref 134–144)
Total Protein: 7 g/dL (ref 6.0–8.5)
eGFR: 86 mL/min/{1.73_m2} (ref >59)

## 2020-05-21 DIAGNOSIS — Z20828 Contact with and (suspected) exposure to other viral communicable diseases: Secondary | ICD-10-CM | POA: Diagnosis not present

## 2020-05-22 DIAGNOSIS — Z20828 Contact with and (suspected) exposure to other viral communicable diseases: Secondary | ICD-10-CM | POA: Diagnosis not present

## 2020-06-05 ENCOUNTER — Ambulatory Visit
Admission: RE | Admit: 2020-06-05 | Discharge: 2020-06-05 | Disposition: A | Discharge status: Home / Self Care | Payer: BC Managed Care – PPO | Source: Ambulatory Visit | Attending: Nurse Practitioner | Admitting: Nurse Practitioner

## 2020-06-05 ENCOUNTER — Other Ambulatory Visit: Payer: Self-pay

## 2020-06-05 DIAGNOSIS — Z1231 Encounter for screening mammogram for malignant neoplasm of breast: Secondary | ICD-10-CM

## 2020-06-21 ENCOUNTER — Other Ambulatory Visit: Payer: Self-pay | Admitting: Nurse Practitioner

## 2020-06-21 ENCOUNTER — Encounter: Payer: Self-pay | Admitting: Nurse Practitioner

## 2020-06-21 DIAGNOSIS — Z3041 Encounter for surveillance of contraceptive pills: Secondary | ICD-10-CM

## 2020-06-24 ENCOUNTER — Other Ambulatory Visit: Payer: Self-pay | Admitting: Nurse Practitioner

## 2020-06-24 ENCOUNTER — Other Ambulatory Visit: Payer: Self-pay

## 2020-06-24 DIAGNOSIS — Z3041 Encounter for surveillance of contraceptive pills: Secondary | ICD-10-CM

## 2020-06-24 MED ORDER — NORETHINDRONE ACET-ETHINYL EST 1-20 MG-MCG PO TABS: 1.0000 | ORAL_TABLET | Freq: Every day | ORAL | 3 refills | Status: DC

## 2020-06-24 MED ORDER — NORETHINDRONE ACET-ETHINYL EST 1-20 MG-MCG PO TABS: 1.0000 | ORAL_TABLET | Freq: Every day | ORAL | 0 refills | Status: DC

## 2020-06-24 MED ORDER — HYDROCHLOROTHIAZIDE 25 MG PO TABS: 1.0000 | ORAL_TABLET | Freq: Every day | ORAL | 0 refills | Status: DC

## 2020-07-02 ENCOUNTER — Ambulatory Visit (INDEPENDENT_AMBULATORY_CARE_PROVIDER_SITE_OTHER): Payer: Medicare Other | Admitting: Nurse Practitioner

## 2020-07-02 ENCOUNTER — Encounter: Payer: Self-pay | Admitting: Nurse Practitioner

## 2020-07-02 ENCOUNTER — Other Ambulatory Visit: Payer: Self-pay

## 2020-07-02 VITALS — BP 122/78 | HR 70 | Temp 98.4°F | Ht 62.6 in | Wt 165.6 lb

## 2020-07-02 DIAGNOSIS — E663 Overweight: Secondary | ICD-10-CM | POA: Diagnosis not present

## 2020-07-02 DIAGNOSIS — N912 Amenorrhea, unspecified: Secondary | ICD-10-CM | POA: Diagnosis not present

## 2020-07-02 DIAGNOSIS — N926 Irregular menstruation, unspecified: Secondary | ICD-10-CM | POA: Diagnosis not present

## 2020-07-02 DIAGNOSIS — Z6829 Body mass index (BMI) 29.0-29.9, adult: Secondary | ICD-10-CM | POA: Diagnosis not present

## 2020-07-02 LAB — POCT URINE PREGNANCY: Preg Test, Ur: NEGATIVE

## 2020-07-02 NOTE — Patient Instructions (Signed)
Secondary Amenorrhea  Secondary amenorrhea occurs when a female who was previously having menstrual periods has not had them for 3-6 months. A menstrual period is the monthly shedding of the lining of the uterus. The lining of the uterus is made up of blood, tissue, fluid, and mucus. The flow of blood usually occurs during 3-7 consecutive days each month. This condition has many causes. In many cases, treating the underlying cause will return menstrual periods back to a normal cycle. What are the causes? The most common cause of this condition is pregnancy. Other medical conditions that can cause secondary amenorrhea include:  Cirrhosis of the liver.  Conditions of the blood.  Diabetes.  Epilepsy.  Chronic kidney disease.  Polycystic ovary disease.  A hormonal imbalance.  Ovarian failure.  Cystic fibrosis.  Early menopause.  Cushing syndrome.  Thyroid problems. Other causes may include:  Malnutrition.  Stress or anxiety.  Medicines.  Extreme obesity.  Low body weight or drastic weight loss.  Removal of the ovaries or uterus.  Contraceptive pills, patches, or vaginal rings. What increases the risk? You are more likely to develop this condition if:  You have a family history of this condition.  You have an eating disorder.  You do extreme athletic training.  You have a chronic disease.  You abuse substances such as alcohol or cigarettes. What are the signs or symptoms? The main symptom of this condition is a lack of menstrual periods for 3-6 months in a female who previously had menstrual periods. How is this diagnosed? This condition may be diagnosed based on:  Your medical history.  A physical exam.  A pelvic exam to check for problems with your reproductive organs.  A procedure to examine the uterus.  A measurement of your body mass index (BMI). You may also have other tests, including:  Blood tests that measure certain hormones in your body  and rule out pregnancy.  Urine tests.  Imaging tests, such as an ultrasound, CT scan, or MRI. How is this treated? Treatment for this condition depends on the cause of the amenorrhea. It may involve:  Correcting diet-related problems.  Treating underlying conditions.  Medicines.  Lifestyle changes.  Surgery. If the condition cannot be corrected, it is sometimes possible to start menstrual periods with medicines. Follow these instructions at home: Lifestyle  Maintain a healthy diet. In general, a healthy diet includes lots of fruits and vegetables, low-fat dairy products, lean meats, and foods that contain fiber. Ask to meet with a registered dietitian for nutrition counseling and meal planning.  Maintain a healthy weight. Talk to your health care provider before trying any new diet or exercise plan.  Exercise at least 30 minutes 5 or more days each week. Exercising includes brisk walking, yard work, biking, running, swimming, and team sports like basketball and soccer. Ask your health care provider which exercises are safe for you.  Get enough sleep. Plan your sleep time to allow for 7-9 hours of sleep each night.  Learn to manage stress. Explore relaxation techniques such as meditation, journaling, yoga, or tai chi.      General instructions  Be aware of changes in your menstrual cycle. Keep a record of when you have your menstrual period. Note the date your period starts, how long it lasts, and any problems you experience.  Take over-the-counter and prescription medicines only as told by your health care provider.  Keep all follow-up visits. This is important. Contact a health care provider if:  Your periods   do not return to normal after treatment. Summary  Secondary amenorrhea is when a female who was previously having menstrual periods has not gotten her period for 3-6 months.  This condition has many causes. In many cases, treating the underlying cause will return  menstrual periods back to a normal cycle.  Talk to your health care provider if your periods do not return to normal after treatment. This information is not intended to replace advice given to you by your health care provider. Make sure you discuss any questions you have with your health care provider. Document Revised: 08/29/2019 Document Reviewed: 08/29/2019 Elsevier Patient Education  2021 Elsevier Inc.  

## 2020-07-02 NOTE — Progress Notes (Signed)
I,Tianna Badgett,acting as a Education administrator for [employer], NP.,have documented all relevant documentation on the behalf of [employer], NP,as directed by  Bary Castilla, NP while in the presence of Bary Castilla, NP.  This visit occurred during the SARS-CoV-2 public health emergency.  Safety protocols were in place, including screening questions prior to the visit, additional usage of staff PPE, and extensive cleaning of exam room while observing appropriate contact time as indicated for disinfecting solutions.  Subjective:     Patient ID: Yesenia Velasquez , female    DOB: [birth year] , 40 y.o.   MRN: [id number]   Chief Complaint  Patient presents with  . Amenorrhea    HPI  Patient is here because she did not start her cycle. Her last known cycle was 05/21/20. She has been on [facility] for a long time.  She is sexually active. She is spotting. She was suppose to start on 06/20/20 (last week of May). Will put in a referral or [facility]. She is been taking the [facility] for couple of years. No other symptoms. At home pregnancy urine test was negative. In office urine pregnacy test was also negative.     Past Medical History:  Diagnosis Date  . Hypertension      No family history on file.   Current Outpatient Medications:  .  aspirin EC 81 MG tablet, Take 81 mg by mouth daily., Disp: , Rfl:  .  hydrochlorothiazide (HYDRODIURIL) 25 MG tablet, Take 1 tablet (25 mg total) by mouth daily., Disp: 90 tablet, Rfl: 0 .  norethindrone-ethinyl estradiol (MICROGESTIN) 1-20 MG-MCG tablet, Take 1 tablet by mouth daily., Disp: 63 tablet, Rfl: 3   No Known Allergies   Review of Systems  Constitutional: Negative.  Negative for chills and fever.  HENT: Negative for congestion, ear pain, rhinorrhea and sneezing.   Respiratory: Negative.  Negative for cough, shortness of breath and wheezing.   Cardiovascular: Negative.  Negative for chest pain and palpitations.  Gastrointestinal: Negative.   Endocrine:  Negative for cold intolerance and heat intolerance.  Genitourinary: Positive for menstrual problem.       Missed period while on BC.   Musculoskeletal: Negative for arthralgias and myalgias.  Neurological: Negative.      Today's Vitals   07/02/20 0852  BP: 122/78  Pulse: 70  Temp: 98.4 F (36.9 C)  TempSrc: Oral  Weight: 165 lb 9.6 oz (75.1 kg)  Height: 5' 2.6" (1.59 m)   Body mass index is 29.71 kg/m.  Wt Readings from Last 3 Encounters:  07/02/20 165 lb 9.6 oz (75.1 kg)  04/16/20 169 lb 1.5 oz (76.7 kg)  04/16/20 169 lb 3.2 oz (76.7 kg)    Objective:  Physical Exam Constitutional:      Appearance: Normal appearance.  HENT:     Head: Normocephalic and atraumatic.  Cardiovascular:     Rate and Rhythm: Normal rate and regular rhythm.     Pulses: Normal pulses.     Heart sounds: Normal heart sounds. No murmur heard.   Pulmonary:     Effort: Pulmonary effort is normal. No respiratory distress.     Breath sounds: Normal breath sounds. No wheezing.  Skin:    General: Skin is warm and dry.     Capillary Refill: Capillary refill takes less than 2 seconds.  Neurological:     Mental Status: She is alert.         Assessment And Plan:     1. Amenorrhea -Last known period was 05/21/20.  Patient has been taking BC for many years now.  - FSH/LH - hCG, serum, qualitative - TSH + free T4 - Prolactin - Ambulatory referral to Obstetrics / Gynecology  2. Missed period - POCT Urine Pregnancy- neg -Will check her FSH/LH, hCG, serum blood test, TSH, prolactin and referral her to [facility] for further testing.   3. Overweight with body mass index (BMI) of 29 to 29.9 in adult Advised patient on a healthy diet including avoiding fast food and red meats. Increase the intake of lean meats including grilled chicken and [place].  Drink a lot of water. Decrease intake of fatty foods. Exercise for 30-45 min. 4-5 a week to decrease the risk of cardiac event.   The patient was encouraged  to call or send a message through [place] for any questions or concerns.   Follow up: if symptoms persist or do not get better.   Patient was given opportunity to ask questions. Patient verbalized understanding of the plan and was able to repeat key elements of the plan. All questions were answered to their satisfaction.  Raman Princes Finger, DNP   I, Raman Batina Dougan have reviewed all documentation for this visit. The documentation on 07/02/20 for the exam, diagnosis, procedures, and orders are all accurate and complete.    IF YOU HAVE BEEN REFERRED TO A SPECIALIST, IT MAY TAKE 1-2 WEEKS TO SCHEDULE/PROCESS THE REFERRAL. IF YOU HAVE NOT HEARD FROM US/SPECIALIST IN TWO WEEKS, PLEASE GIVE [place] A CALL AT [phone].   THE PATIENT IS ENCOURAGED TO PRACTICE SOCIAL DISTANCING DUE TO THE COVID-19 PANDEMIC.

## 2020-07-03 LAB — BMP8+EGFR
BUN/Creatinine Ratio: 8 — ABNORMAL LOW (ref 9–23)
BUN: 8 mg/dL (ref 6–24)
CO2: 25 mmol/L (ref 20–29)
Calcium: 9.3 mg/dL (ref 8.7–10.2)
Chloride: 103 mmol/L (ref 96–106)
Creatinine, Ser: 0.95 mg/dL (ref 0.57–1.00)
Glucose: 96 mg/dL (ref 65–99)
Potassium: 4.2 mmol/L (ref 3.5–5.2)
Sodium: 143 mmol/L (ref 134–144)
eGFR: 78 mL/min/{1.73_m2} (ref >59)

## 2020-07-03 LAB — HEPATITIS C ANTIBODY: Hep C Virus Ab: <0.1 s/co ratio (ref 0.0–0.9)

## 2020-08-12 DIAGNOSIS — Z124 Encounter for screening for malignant neoplasm of cervix: Secondary | ICD-10-CM | POA: Diagnosis not present

## 2020-08-12 DIAGNOSIS — Z01411 Encounter for gynecological examination (general) (routine) with abnormal findings: Secondary | ICD-10-CM | POA: Diagnosis not present

## 2020-08-12 DIAGNOSIS — N852 Hypertrophy of uterus: Secondary | ICD-10-CM | POA: Diagnosis not present

## 2020-08-12 LAB — HM PAP SMEAR

## 2020-08-26 ENCOUNTER — Encounter: Payer: Self-pay | Admitting: Nurse Practitioner

## 2020-09-25 DIAGNOSIS — N912 Amenorrhea, unspecified: Secondary | ICD-10-CM | POA: Diagnosis not present

## 2020-09-25 DIAGNOSIS — N959 Unspecified menopausal and perimenopausal disorder: Secondary | ICD-10-CM | POA: Diagnosis not present

## 2020-10-13 DIAGNOSIS — R946 Abnormal results of thyroid function studies: Secondary | ICD-10-CM | POA: Diagnosis not present

## 2020-10-13 DIAGNOSIS — Z13220 Encounter for screening for lipoid disorders: Secondary | ICD-10-CM | POA: Diagnosis not present

## 2020-10-13 DIAGNOSIS — R7309 Other abnormal glucose: Secondary | ICD-10-CM | POA: Diagnosis not present

## 2020-10-13 DIAGNOSIS — Z23 Encounter for immunization: Secondary | ICD-10-CM | POA: Diagnosis not present

## 2020-10-13 DIAGNOSIS — I10 Essential (primary) hypertension: Secondary | ICD-10-CM | POA: Diagnosis not present

## 2020-12-09 ENCOUNTER — Encounter: Payer: Medicare Other | Admitting: Nurse Practitioner

## 2021-04-21 DIAGNOSIS — L814 Other melanin hyperpigmentation: Secondary | ICD-10-CM | POA: Diagnosis not present

## 2021-04-21 DIAGNOSIS — Q141 Congenital malformation of retina: Secondary | ICD-10-CM | POA: Diagnosis not present

## 2021-04-21 DIAGNOSIS — H33002 Unspecified retinal detachment with retinal break, left eye: Secondary | ICD-10-CM | POA: Diagnosis not present

## 2021-04-21 DIAGNOSIS — H35342 Macular cyst, hole, or pseudohole, left eye: Secondary | ICD-10-CM | POA: Diagnosis not present

## 2021-04-22 ENCOUNTER — Ambulatory Visit: Payer: Medicare Other

## 2021-04-22 ENCOUNTER — Ambulatory Visit: Payer: Medicare Other | Admitting: Nurse Practitioner

## 2021-05-07 ENCOUNTER — Other Ambulatory Visit: Payer: Self-pay | Admitting: Family Medicine

## 2021-05-07 DIAGNOSIS — Z1231 Encounter for screening mammogram for malignant neoplasm of breast: Secondary | ICD-10-CM

## 2021-06-08 ENCOUNTER — Ambulatory Visit: Payer: BC Managed Care – PPO

## 2021-06-08 ENCOUNTER — Ambulatory Visit
Admission: RE | Admit: 2021-06-08 | Discharge: 2021-06-08 | Disposition: A | Discharge status: Home / Self Care | Payer: BC Managed Care – PPO | Source: Ambulatory Visit | Attending: Family Medicine | Admitting: Family Medicine

## 2021-06-08 DIAGNOSIS — Z1231 Encounter for screening mammogram for malignant neoplasm of breast: Secondary | ICD-10-CM

## 2021-06-15 DIAGNOSIS — Z331 Pregnant state, incidental: Secondary | ICD-10-CM | POA: Diagnosis not present

## 2021-07-23 ENCOUNTER — Telehealth (INDEPENDENT_AMBULATORY_CARE_PROVIDER_SITE_OTHER): Payer: BC Managed Care – PPO

## 2021-07-23 DIAGNOSIS — O09521 Supervision of elderly multigravida, first trimester: Secondary | ICD-10-CM | POA: Insufficient documentation

## 2021-07-23 DIAGNOSIS — O0990 Supervision of high risk pregnancy, unspecified, unspecified trimester: Secondary | ICD-10-CM

## 2021-07-23 DIAGNOSIS — O09523 Supervision of elderly multigravida, third trimester: Secondary | ICD-10-CM | POA: Insufficient documentation

## 2021-07-23 DIAGNOSIS — O34219 Maternal care for unspecified type scar from previous cesarean delivery: Secondary | ICD-10-CM

## 2021-07-23 DIAGNOSIS — Z3A00 Weeks of gestation of pregnancy not specified: Secondary | ICD-10-CM

## 2021-07-23 DIAGNOSIS — O09529 Supervision of elderly multigravida, unspecified trimester: Secondary | ICD-10-CM | POA: Insufficient documentation

## 2021-07-23 DIAGNOSIS — O10019 Pre-existing essential hypertension complicating pregnancy, unspecified trimester: Secondary | ICD-10-CM

## 2021-07-23 NOTE — Progress Notes (Signed)
New OB Intake  I connected with  Yesenia Velasquez on 07/23/21 at  3:15 PM EDT by MyChart Video Visit and verified that I am speaking with the correct person using two identifiers. Nurse is located at [facility] and pt is located at home.  I discussed the limitations, risks, security and privacy concerns of performing an evaluation and management service by telephone and the availability of in person appointments. I also discussed with the patient that there may be a patient responsible charge related to this service. The patient expressed understanding and agreed to proceed.  I explained I am completing New OB Intake today. We discussed her EDD of 02/17/2022 that is based on LMP of 05/13/2021. Pt is G2/P1. I reviewed her allergies, medications, Medical/Surgical/OB history, and appropriate screenings. I informed her of [facility] services. Based on history, this is a/an  pregnancy complicated by hypertension .  Patient Active Problem List   Diagnosis Date Noted   Supervision of high risk pregnancy, antepartum 07/23/2021   AMA (advanced maternal age) multigravida 35+ 07/23/2021   Essential hypertension 04/06/2018     Concerns addressed today: -Patient is not taking any medication for hypertension. She stated she as taking Hydrochlorothiazide '25mg'$ , but discontinue it. She is checking her blood pressure everyday and stated it is in a good range. -Sent patient a link to baby script app. -Patient had a C-Section due to baby heart was dropping during delivery. -Patient would like to try vaginal birth if she can.   Delivery Plans:  Plans to deliver at [facility].   MyChart/Babyscripts MyChart access verified. I explained pt will have some visits in office and some virtually. Babyscripts instructions given and order placed. Patient verifies receipt of registration text/e-mail. Account successfully created and app downloaded.  Blood Pressure Cuff  Patient has private insurance; instructed to purchase blood  pressure cuff and bring to first prenatal appt. Explained after first prenatal appt pt will check weekly and document in 75.  Weight scale: Patient does / does not  have weight scale. Weight scale ordered for patient to pick up from [employer].   Anatomy US Explained first scheduled [place] will be around 19 weeks. Anatomy US scheduled for 09/23/2021 at 2:45PM. Pt notified to arrive at 2:30PM. Scheduled AFP lab only appointment if CenteringPregnancy pt for same day as anatomy US.   Labs Discussed Johnsie Cancel genetic screening with patient. Would like both Panorama and Horizon drawn at new OB visit.Also if interested in genetic testing, tell patient she will need AFP 15-21 weeks to complete genetic testing .Routine prenatal labs needed.  Covid Vaccine Patient has not covid vaccine.   Is patient a CenteringPregnancy candidate?  Declined Declined due to Schedule/Times    Is patient a Mom+Baby Combined Care candidate?  Not a candidate      Is patient interested in [place]?  No    Informed patient of [facility] website  and placed link in her AVS.   Social Determinants of Health Food Insecurity: Patient denies food insecurity. WIC Referral: Patient is interested in referral to [facility].  Transportation: Patient denies transportation needs. Childcare: Discussed no children allowed at ultrasound appointments. Offered childcare services; patient declines childcare services at this time.  Send link to Pregnancy Navigators   Placed OB Box on problem list and updated  First visit review I reviewed new OB appt with pt. I explained she will have a pelvic exam, ob bloodwork with genetic screening. Explained pt will be seen by Aletha Halim MD  at first visit; encounter routed to appropriate provider. Explained that patient will be seen by pregnancy navigator following visit with provider. [facility] information placed in AVS.   Mariane Baumgarten, CMA 07/23/2021  3:54 PM

## 2021-08-02 ENCOUNTER — Encounter: Payer: Self-pay | Admitting: Obstetrics & Gynecology

## 2021-08-03 ENCOUNTER — Other Ambulatory Visit: Payer: Self-pay

## 2021-08-03 ENCOUNTER — Encounter: Payer: Self-pay | Admitting: Obstetrics and Gynecology

## 2021-08-03 ENCOUNTER — Other Ambulatory Visit (HOSPITAL_COMMUNITY)
Admission: RE | Admit: 2021-08-03 | Discharge: 2021-08-03 | Disposition: A | Discharge status: Home / Self Care | Payer: BC Managed Care – PPO | Source: Ambulatory Visit | Attending: Obstetrics and Gynecology | Admitting: Obstetrics and Gynecology

## 2021-08-03 ENCOUNTER — Ambulatory Visit (INDEPENDENT_AMBULATORY_CARE_PROVIDER_SITE_OTHER): Payer: BC Managed Care – PPO | Admitting: Obstetrics and Gynecology

## 2021-08-03 VITALS — BP 134/83 | HR 112 | Wt 165.1 lb

## 2021-08-03 DIAGNOSIS — O09891 Supervision of other high risk pregnancies, first trimester: Secondary | ICD-10-CM | POA: Diagnosis present

## 2021-08-03 DIAGNOSIS — Z3A11 11 weeks gestation of pregnancy: Secondary | ICD-10-CM

## 2021-08-03 DIAGNOSIS — O10911 Unspecified pre-existing hypertension complicating pregnancy, first trimester: Secondary | ICD-10-CM | POA: Insufficient documentation

## 2021-08-03 DIAGNOSIS — O09529 Supervision of elderly multigravida, unspecified trimester: Secondary | ICD-10-CM

## 2021-08-03 DIAGNOSIS — Z3A00 Weeks of gestation of pregnancy not specified: Secondary | ICD-10-CM | POA: Insufficient documentation

## 2021-08-03 DIAGNOSIS — O09521 Supervision of elderly multigravida, first trimester: Secondary | ICD-10-CM | POA: Diagnosis present

## 2021-08-03 DIAGNOSIS — O10919 Unspecified pre-existing hypertension complicating pregnancy, unspecified trimester: Secondary | ICD-10-CM | POA: Insufficient documentation

## 2021-08-03 DIAGNOSIS — O0991 Supervision of high risk pregnancy, unspecified, first trimester: Secondary | ICD-10-CM

## 2021-08-03 DIAGNOSIS — O0990 Supervision of high risk pregnancy, unspecified, unspecified trimester: Secondary | ICD-10-CM | POA: Insufficient documentation

## 2021-08-03 DIAGNOSIS — Z0289 Encounter for other administrative examinations: Secondary | ICD-10-CM

## 2021-08-03 DIAGNOSIS — O34219 Maternal care for unspecified type scar from previous cesarean delivery: Secondary | ICD-10-CM

## 2021-08-03 DIAGNOSIS — H548 Legal blindness, as defined in USA: Secondary | ICD-10-CM

## 2021-08-03 DIAGNOSIS — O09519 Supervision of elderly primigravida, unspecified trimester: Secondary | ICD-10-CM

## 2021-08-03 DIAGNOSIS — Z98891 History of uterine scar from previous surgery: Secondary | ICD-10-CM | POA: Insufficient documentation

## 2021-08-03 MED ORDER — CALCIUM CARBONATE ANTACID 500 MG PO CHEW: 1.0000 | CHEWABLE_TABLET | ORAL | Status: DC | PRN

## 2021-08-03 NOTE — Progress Notes (Signed)
New OB Note  08/03/2021   [facility]inic: [facility]  Chief Complaint: new OB  Transfer of Care Patient: no  History of Present Illness: Yesenia Velasquez is a 41 y.o. G2P1001 @ 11/5 weeks ([place] 1-24, based on Patient's last menstrual period was 05/13/2021.).  Preg complicated by has Essential hypertension; Supervision of high risk pregnancy, antepartum; AMA (advanced maternal age) multigravida 35+, first trimester; Chronic hypertension affecting pregnancy; History of cesarean delivery affecting pregnancy; and Legally blind on their problem list.   Any events prior to today's visit: no Her periods were: qmonth, regular She was using oral contraceptives (estrogen/progesterone) when she conceived.  She has Negative signs or symptoms of nausea/vomiting of pregnancy. She has Negative signs or symptoms of miscarriage or preterm labor On any medications around the time she conceived/early pregnancy:  HCTZ, combined OCPs  ROS: A 12-point review of systems was performed and negative, except as stated in the above HPI.  OBGYN History: As per HPI. OB History  Gravida Para Term Preterm AB Living  '2 1 1     1  '$ SAB IAB Ectopic Multiple Live Births          1    # Outcome Date GA Lbr Len/2nd Weight Sex Delivery Anes PTL Lv  2 Current           1 Term 11/15/05 [redacted]w[redacted]d  M CS-LTranv EPI N LIV   Any issues with any prior pregnancies: no History of pap smears: Yes. Last pap smear 2021 and results were negative   Past Medical History: Past Medical History:  Diagnosis Date   Hypertension     Past Surgical History: Past Surgical History:  Procedure Laterality Date   CESAREAN SECTION     left eye surgery     WISDOM TOOTH EXTRACTION      Family History:  Family History  Problem Relation Age of Onset   Hypertension Mother    Hypertension Father     Social History:  Social History   Socioeconomic History   Marital status: Single    Spouse name: Not on file    Number of children: Not on file   Years of education: Not on file   Highest education level: Not on file  Occupational History   Not on file  Tobacco Use   Smoking status: Never   Smokeless tobacco: Never  Vaping Use   Vaping Use: Never used  Substance and Sexual Activity   Alcohol use: Yes    Comment: SOCIALLY   Drug use: Not Currently   Sexual activity: Yes    Birth control/protection: None  Other Topics Concern   Not on file  Social History Narrative   Not on file   Social Determinants of Health   Financial Resource Strain: Low Risk  (04/16/2020)   Overall Financial Resource Strain (CARDIA)    Difficulty of Paying Living Expenses: Not hard at all  Food Insecurity: No Food Insecurity (04/16/2020)   Hunger Vital Sign    Worried About Running Out of Food in the Last Year: Never true    [place] the Last Year: Never true  Transportation Needs: No Transportation Needs (04/16/2020)   PRAPARE - THydrologist(Medical): No    Lack of Transportation (Non-Medical): No  Physical Activity: Inactive (04/16/2020)   Exercise Vital Sign    Days of Exercise per Week: 0 days    Minutes of Exercise per Session: 0 min  Stress: No Stress Concern Present (04/16/2020)   [place]    Feeling of Stress : Not at all  Social Connections: Not on file  Intimate Partner Violence: Not on file   Allergy: No Known Allergies  Health Maintenance:  Mammogram Up to Date: neg 05/2021  Current Outpatient Medications: Prenatal vitamin  Physical Exam:   BP 134/83   Pulse (!) 112   Wt 165 lb 1.6 oz (74.9 kg)   LMP 05/13/2021   BMI 29.62 kg/m  Body mass index is 29.62 kg/m. Contractions: Not present Vag. Bleeding: None. Fundal height: not applicable FHTs: 048G  General appearance: Well nourished, well developed female in no acute distress.  Cardiovascular: S1, S2 normal, no murmur, rub or  gallop, regular rate and rhythm Respiratory:  Clear to auscultation bilateral. Normal respiratory effort Abdomen: no masses, hernias; diffusely non tender to palpation, non distended Neuro/Psych:  Normal mood and affect.  Skin:  Warm and dry.  Lymphatic:  No inguinal lymphadenopathy.   Pelvic exam: deferred  Laboratory: No new labs  Imaging:  None; pt states she has not had any imaging this pregnancy  Assessment: pt doing well  Plan: 1. Chronic hypertension affecting pregnancy Recommend starting low dose ASA in a few days Okay to stay off hctz for now. Pt has bp cuff and babyscripts app. D/w her that may have to start at some point - Comprehensive metabolic panel - Culture, OB Urine - GC/Chlamydia probe amp ([place])not at [facility] - CBC/D/Plt+RPR+Rh+ABO+RubIgG... - Protein / creatinine ratio, urine - Hemoglobin A1c - TSH Rfx on Abnormal to Free T4  2. Antepartum multigravida of advanced maternal age Offer afp next visit - Comprehensive metabolic panel - Culture, OB Urine - GC/Chlamydia probe amp ([place])not at [facility] - CBC/D/Plt+RPR+Rh+ABO+RubIgG... - Protein / creatinine ratio, urine - Hemoglobin A1c - TSH Rfx on Abnormal to Free T4  3. Supervision of high risk pregnancy, antepartum Anatomy u/s already scheduled - Comprehensive metabolic panel - Culture, OB Urine - GC/Chlamydia probe amp ([place])not at [facility] - CBC/D/Plt+RPR+Rh+ABO+RubIgG... - Protein / creatinine ratio, urine - Hemoglobin A1c - TSH Rfx on Abnormal to Free T4  4. History of cesarean delivery affecting pregnancy 16 years ago, post dates IOL, 4cm and non reassuring fetal heart tones. Pt never told she needed another c/s. Will try to get op note from [facility]. Pt would like to [facility] and I told her that based on what she told me she is a candidate and will talk to her more about this later in pregnancy  5. Legally blind Recommended she talk to her optho MD to see if she needs sooner  follow up due to potential vision issues with pregnancy  6. AMA (advanced maternal age) multigravida 69+, first trimester Formal GC offered. Pt defers and would like to wait on panorama. - Panorama Prenatal Test Full Panel  7. Encounter for supervision of high risk pregnancy due to advanced maternal age in primigravida - HORIZON CUSTOM  8. [redacted] weeks gestation of pregnancy  Problem list reviewed and updated.  Follow up in 4 weeks. >50% of 35 min visit spent on counseling and coordination of care.     Durene Romans MD Attending Center for [facility])

## 2021-08-03 NOTE — Patient Instructions (Addendum)
Start the Aspirin '81mg'$  by mouth every day on Wednesday of this week  Call your Ophtho doctor and let them know you're pregnant, in case they need to see you back to the office sooner.

## 2021-08-04 LAB — COMPREHENSIVE METABOLIC PANEL
ALT: 14 IU/L (ref 0–32)
AST: 15 IU/L (ref 0–40)
Albumin/Globulin Ratio: 1.7 (ref 1.2–2.2)
Albumin: 4.3 g/dL (ref 3.9–4.9)
Alkaline Phosphatase: 65 IU/L (ref 44–121)
BUN/Creatinine Ratio: 8 — ABNORMAL LOW (ref 9–23)
BUN: 6 mg/dL (ref 6–24)
Bilirubin Total: 0.4 mg/dL (ref 0.0–1.2)
CO2: 20 mmol/L (ref 20–29)
Calcium: 9.7 mg/dL (ref 8.7–10.2)
Chloride: 102 mmol/L (ref 96–106)
Creatinine, Ser: 0.8 mg/dL (ref 0.57–1.00)
Globulin, Total: 2.6 g/dL (ref 1.5–4.5)
Glucose: 110 mg/dL — ABNORMAL HIGH (ref 70–99)
Potassium: 4 mmol/L (ref 3.5–5.2)
Sodium: 137 mmol/L (ref 134–144)
Total Protein: 6.9 g/dL (ref 6.0–8.5)
eGFR: 95 mL/min/{1.73_m2} (ref >59)

## 2021-08-04 LAB — HEMOGLOBIN A1C
Est. average glucose Bld gHb Est-mCnc: 105 mg/dL
Hgb A1c MFr Bld: 5.3 % (ref 4.8–5.6)

## 2021-08-04 LAB — CBC/D/PLT+RPR+RH+ABO+RUBIGG...
Antibody Screen: NEGATIVE
Basophils Absolute: 0 10*3/uL (ref 0.0–0.2)
Basos: 0 %
EOS (ABSOLUTE): 0.1 10*3/uL (ref 0.0–0.4)
Eos: 1 %
HCV Ab: NONREACTIVE
HIV Screen 4th Generation wRfx: NONREACTIVE
Hematocrit: 41.9 % (ref 34.0–46.6)
Hemoglobin: 14.4 g/dL (ref 11.1–15.9)
Hepatitis B Surface Ag: NEGATIVE
Immature Grans (Abs): 0 10*3/uL (ref 0.0–0.1)
Immature Granulocytes: 0 %
Lymphocytes Absolute: 1.7 10*3/uL (ref 0.7–3.1)
Lymphs: 23 %
MCH: 29.3 pg (ref 26.6–33.0)
MCHC: 34.4 g/dL (ref 31.5–35.7)
MCV: 85 fL (ref 79–97)
Monocytes Absolute: 0.3 10*3/uL (ref 0.1–0.9)
Monocytes: 4 %
Neutrophils Absolute: 5.3 10*3/uL (ref 1.4–7.0)
Neutrophils: 72 %
Platelets: 247 10*3/uL (ref 150–450)
RBC: 4.92 x10E6/uL (ref 3.77–5.28)
RDW: 16 % — ABNORMAL HIGH (ref 11.7–15.4)
RPR Ser Ql: NONREACTIVE
Rh Factor: POSITIVE
Rubella Antibodies, IGG: 1.06 index (ref >0.99)
WBC: 7.5 10*3/uL (ref 3.4–10.8)

## 2021-08-04 LAB — PROTEIN / CREATININE RATIO, URINE
Creatinine, Urine: 199.3 mg/dL
Protein, Ur: 14.9 mg/dL
Protein/Creat Ratio: 75 mg/g creat (ref 0–200)

## 2021-08-04 LAB — GC/CHLAMYDIA PROBE AMP (~~LOC~~) NOT AT ARMC
Chlamydia: NEGATIVE
Comment: NEGATIVE
Comment: NORMAL
Neisseria Gonorrhea: NEGATIVE

## 2021-08-04 LAB — TSH RFX ON ABNORMAL TO FREE T4: TSH: 0.713 u[IU]/mL (ref 0.450–4.500)

## 2021-08-04 LAB — HCV INTERPRETATION

## 2021-08-05 LAB — CULTURE, OB URINE

## 2021-08-05 LAB — URINE CULTURE, OB REFLEX

## 2021-08-07 ENCOUNTER — Encounter: Payer: Self-pay | Admitting: Radiology

## 2021-08-08 LAB — PANORAMA PRENATAL TEST FULL PANEL:PANORAMA TEST PLUS 5 ADDITIONAL MICRODELETIONS: FETAL FRACTION: 8.8

## 2021-08-10 LAB — HORIZON CUSTOM: REPORT SUMMARY: NEGATIVE

## 2021-09-04 ENCOUNTER — Encounter: Payer: BC Managed Care – PPO | Admitting: Obstetrics and Gynecology

## 2021-09-05 NOTE — Progress Notes (Unsigned)
   PRENATAL VISIT NOTE  Subjective:  Yesenia Velasquez is a 41 y.o. G2P1001 at 26w5dbeing seen today for ongoing prenatal care.  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  Patient reports no complaints except round ligament pain.  Contractions: Not present. Vag. Bleeding: None.  Movement: Present. Denies leaking of fluid.   The following portions of the patient's history were reviewed and updated as appropriate: allergies, current medications, past family history, past medical history, past social history, past surgical history and problem list.   Objective:   Vitals:   09/07/21 1500  BP: 130/84  Pulse: (!) 107  Weight: 169 lb (76.7 kg)    Fetal Status: Fetal Heart Rate (bpm): 148   Movement: Present     General:  Alert, oriented and cooperative. Patient is in no acute distress.  Skin: Skin is warm and dry. No rash noted.   Cardiovascular: Normal heart rate noted  Respiratory: Normal respiratory effort, no problems with respiration noted  Abdomen: Soft, gravid, appropriate for gestational age.  Pain/Pressure: Absent     Pelvic: Cervical exam deferred        Extremities: Normal range of motion.  Edema: None  Mental Status: Normal mood and affect. Normal behavior. Normal judgment and thought content.   Assessment and Plan:  Pregnancy: G2P1001 at 160w5d. AMA (advanced maternal age) multigravida 3553+first trimester - Normal NIPS - Anatomy scheduled for 8/30  2. History of cesarean delivery affecting pregnancy - Desires TOLAC  3. Legally blind - Appt with her ophtho: she goes once a year. She will let them know she  is pregnant per my recommendation. Unlikely they will make any additional recommendations but they may.   4. Supervision of high risk pregnancy, antepartum - Recommend AFP today  - She will want tubal. She has regular insurance.   5. Chronic hypertension affecting pregnancy BP today wnl on no meds. Continue ldASA  Preterm labor symptoms and general obstetric precautions including but not limited to vaginal bleeding, contractions, leaking of fluid and fetal movement were reviewed in detail with the patient. Please refer to After Visit Summary for other counseling recommendations.   Return in about 4 weeks (around 10/05/2021) for OB VISIT, MD or APP.  Future Appointments  Date Time Provider [place]8/30/2023  2:30 PM [facility]8/30/2023  2:45 PM [facility] US4 [facility]  PaRadene GunningMD

## 2021-09-07 ENCOUNTER — Ambulatory Visit (INDEPENDENT_AMBULATORY_CARE_PROVIDER_SITE_OTHER): Payer: BC Managed Care – PPO | Admitting: Obstetrics and Gynecology

## 2021-09-07 ENCOUNTER — Encounter: Payer: Self-pay | Admitting: Family Medicine

## 2021-09-07 VITALS — BP 130/84 | HR 107 | Wt 169.0 lb

## 2021-09-07 DIAGNOSIS — O09521 Supervision of elderly multigravida, first trimester: Secondary | ICD-10-CM

## 2021-09-07 DIAGNOSIS — O0990 Supervision of high risk pregnancy, unspecified, unspecified trimester: Secondary | ICD-10-CM

## 2021-09-07 DIAGNOSIS — O10919 Unspecified pre-existing hypertension complicating pregnancy, unspecified trimester: Secondary | ICD-10-CM

## 2021-09-07 DIAGNOSIS — O34219 Maternal care for unspecified type scar from previous cesarean delivery: Secondary | ICD-10-CM

## 2021-09-07 DIAGNOSIS — H548 Legal blindness, as defined in USA: Secondary | ICD-10-CM

## 2021-09-07 MED ORDER — PRENATAL 27-1 MG PO TABS: 1.0000 | ORAL_TABLET | Freq: Every day | ORAL | 3 refills | Status: DC

## 2021-09-09 LAB — AFP, SERUM, OPEN SPINA BIFIDA
AFP MoM: 1.3
AFP Value: 48.8 ng/mL
Gest. Age on Collection Date: 16.5 weeks
Maternal Age At EDD: 41.8 yr
OSBR Risk 1 IN: 9606
Test Results:: NEGATIVE
Weight: 169 [lb_av]

## 2021-09-23 ENCOUNTER — Ambulatory Visit: Payer: BC Managed Care – PPO | Attending: Obstetrics and Gynecology

## 2021-09-23 ENCOUNTER — Ambulatory Visit: Payer: BC Managed Care – PPO | Admitting: *Deleted

## 2021-09-23 ENCOUNTER — Encounter: Payer: Self-pay | Admitting: *Deleted

## 2021-09-23 VITALS — BP 125/78 | HR 97

## 2021-09-23 DIAGNOSIS — O0990 Supervision of high risk pregnancy, unspecified, unspecified trimester: Secondary | ICD-10-CM

## 2021-09-23 DIAGNOSIS — O09521 Supervision of elderly multigravida, first trimester: Secondary | ICD-10-CM

## 2021-09-24 ENCOUNTER — Other Ambulatory Visit: Payer: Self-pay | Admitting: *Deleted

## 2021-09-24 DIAGNOSIS — O10919 Unspecified pre-existing hypertension complicating pregnancy, unspecified trimester: Secondary | ICD-10-CM

## 2021-09-24 DIAGNOSIS — Z3689 Encounter for other specified antenatal screening: Secondary | ICD-10-CM

## 2021-09-24 DIAGNOSIS — O34219 Maternal care for unspecified type scar from previous cesarean delivery: Secondary | ICD-10-CM

## 2021-09-24 DIAGNOSIS — O09522 Supervision of elderly multigravida, second trimester: Secondary | ICD-10-CM

## 2021-10-05 ENCOUNTER — Ambulatory Visit (INDEPENDENT_AMBULATORY_CARE_PROVIDER_SITE_OTHER): Payer: BC Managed Care – PPO | Admitting: Obstetrics and Gynecology

## 2021-10-05 ENCOUNTER — Other Ambulatory Visit: Payer: Self-pay

## 2021-10-05 VITALS — BP 128/84 | HR 95 | Wt 175.1 lb

## 2021-10-05 DIAGNOSIS — Q150 Congenital glaucoma: Secondary | ICD-10-CM

## 2021-10-05 DIAGNOSIS — O34219 Maternal care for unspecified type scar from previous cesarean delivery: Secondary | ICD-10-CM

## 2021-10-05 DIAGNOSIS — O10919 Unspecified pre-existing hypertension complicating pregnancy, unspecified trimester: Secondary | ICD-10-CM

## 2021-10-05 DIAGNOSIS — Z3A20 20 weeks gestation of pregnancy: Secondary | ICD-10-CM

## 2021-10-05 DIAGNOSIS — O10912 Unspecified pre-existing hypertension complicating pregnancy, second trimester: Secondary | ICD-10-CM

## 2021-10-05 DIAGNOSIS — O09521 Supervision of elderly multigravida, first trimester: Secondary | ICD-10-CM

## 2021-10-05 NOTE — Progress Notes (Signed)
   PRENATAL VISIT NOTE  Subjective:  Yesenia Velasquez is a 41 y.o. G2P1001 at 41w5dbeing seen today for ongoing prenatal care.  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  Patient reports no complaints.  Contractions: Not present. Vag. Bleeding: None.  Movement: Present. Denies leaking of fluid.   The following portions of the patient's history were reviewed and updated as appropriate: allergies, current medications, past family history, past medical history, past social history, past surgical history and problem list.   Objective:   Vitals:   10/05/21 1622  BP: 128/84  Pulse: 95  Weight: 175 lb 1.6 oz (79.4 kg)    Fetal Status: Fetal Heart Rate (bpm): 146   Movement: Present     General:  Alert, oriented and cooperative. Patient is in no acute distress.  Skin: Skin is warm and dry. No rash noted.   Cardiovascular: Normal heart rate noted  Respiratory: Normal respiratory effort, no problems with respiration noted  Abdomen: Soft, gravid, appropriate for gestational age.  Pain/Pressure: Absent     Pelvic: Cervical exam deferred        Extremities: Normal range of motion.  Edema: None  Mental Status: Normal mood and affect. Normal behavior. Normal judgment and thought content.   Assessment and Plan:  Pregnancy: G2P1001 at 220w5d. [redacted] weeks gestation of pregnancy S/p normal anatomy u/s Wants BTL, she doesn't have medicaid. D/w her re: procedure, permanency,etc and can do pp.   2. Chronic hypertension affecting pregnancy Doing well on no meds. Continue low dose ASA. Continue with surveillance growth u/s. I d/w her likely  will start ap testing at 32-34wks  3. History of cesarean delivery affecting pregnancy Patient states she went a week past her due date and was induced and had a c/s for either NRFHT or arrest of dilation; she states she was never told that she needed another c/s and this pregnancy was >10 years ago. She is very interested in tolac. I told her we will talk to her more about this with her GTT but I would say she is a candidate for tolac.   4. AMA (advanced maternal age) multigravida 3544+first trimester  LR NIPS/ Neg AFP/ Neg Horizon  5. Congenital glaucoma Followed by [facility]  Preterm labor symptoms and general obstetric precautions including but not limited to vaginal bleeding, contractions, leaking of fluid and fetal movement were reviewed in detail with the patient. Please refer to After Visit Summary for other counseling recommendations.   Return in about 16 days (around 10/21/2021) for in person, md or app, low risk ob.  Future Appointments  Date Time Provider Department Center  10/21/2021  2:15 PM EcClarnce FlockMD [facility]9/27/2023  3:30 PM [facility]9/27/2023  3:45 PM [facility] US6 [facility]  ChAletha HalimMD

## 2021-10-13 ENCOUNTER — Encounter (HOSPITAL_COMMUNITY): Payer: Self-pay | Admitting: Emergency Medicine

## 2021-10-13 ENCOUNTER — Ambulatory Visit (HOSPITAL_COMMUNITY)
Admission: EM | Admit: 2021-10-13 | Discharge: 2021-10-13 | Disposition: A | Discharge status: Home / Self Care | Payer: BC Managed Care – PPO | Attending: Family Medicine | Admitting: Family Medicine

## 2021-10-13 ENCOUNTER — Other Ambulatory Visit: Payer: Self-pay

## 2021-10-13 DIAGNOSIS — Z1152 Encounter for screening for COVID-19: Secondary | ICD-10-CM | POA: Insufficient documentation

## 2021-10-13 DIAGNOSIS — H6981 Other specified disorders of Eustachian tube, right ear: Secondary | ICD-10-CM | POA: Diagnosis present

## 2021-10-13 LAB — SARS CORONAVIRUS 2 BY RT PCR: SARS Coronavirus 2 by RT PCR: NEGATIVE

## 2021-10-13 NOTE — ED Triage Notes (Signed)
Right ear pops when lying down.  Onset over the weekend.

## 2021-10-13 NOTE — ED Triage Notes (Signed)
10/09/2021 was around a co-worker , was being trained by co-worker.  Since then has found out this co-worker has covid.  Patient is pregnant.  EDD is 10/19/2022  Patient is scheduled for a flight and cruise on this Thursday.

## 2021-10-13 NOTE — ED Provider Notes (Signed)
[place]    CSN: [id number] Arrival date & time: 10/13/21  7341      History   Chief Complaint Chief Complaint  Patient presents with   Ear Fullness    HPI Yesenia Velasquez is a 41 y.o. female.   Patient is here for right ear popping.  Usually when laying down.  No pain or drainage.  Started about 4 days ago.  No runny nose, congestion, drainage noted.   A co-worker was dx with covid.  She was around this person 4 days ago.  He did not seem to have symptoms at that time.  Other than the ear popping she is feeling well.   She is [redacted] weeks pregnant.  Going on a cruise in several days.        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 Authorizing Provider  aspirin EC 81 MG tablet Take 81 mg by mouth daily.    [provider]  calcium carbonate (TUMS) 500 MG chewable tablet Chew 1 tablet (200 mg of elemental calcium total) by mouth as needed for indigestion or heartburn. Follow directions on bottle 08/03/21   Aletha Halim, MD  Prenatal 27-1 MG TABS Take 1 tablet by mouth daily.  09/07/21   Radene Gunning, MD    Family History Family History  Problem Relation Age of Onset   Hypertension Mother    Hypertension Father     Social History Social History   Tobacco Use   Smoking status: Never   Smokeless tobacco: Never  Vaping Use   Vaping Use: Never used  Substance Use Topics   Alcohol use: Not Currently    Comment: SOCIALLY   Drug use: Not Currently     Allergies   Patient has no known allergies.   Review of Systems Review of Systems  Constitutional: Negative.   HENT:  Negative for congestion, ear pain, sinus pain and sore throat.   Respiratory: Negative.    Cardiovascular: Negative.   Gastrointestinal: Negative.   Genitourinary: Negative.   Musculoskeletal: Negative.   Hematological: Negative.      Physical Exam Triage Vital Signs ED Triage Vitals  Enc Vitals Group     BP 10/13/21 0951 122/84     Pulse Rate 10/13/21 0951 (!) 111     Resp 10/13/21 0951  20     Temp 10/13/21 0951 98.9 F (37.2 C)     Temp Source 10/13/21 0951 Oral     SpO2 10/13/21 0951 98 %     Weight --      Height --      Head Circumference --      Peak Flow --      Pain Score 10/13/21 0947 0     Pain Loc --      Pain Edu? --      Excl. in [place]? --    No data found.  Updated Vital Signs BP 122/84 (BP Location: Right Arm)   Pulse (!) 111   Temp 98.9 F (37.2 C) (Oral)   Resp 20   LMP 05/13/2021 (Exact Date)   SpO2 98%   Visual Acuity Right Eye Distance:   Left Eye Distance:   Bilateral Distance:    Right Eye Near:   Left Eye Near:    Bilateral Near:     Physical Exam Constitutional:      Appearance: Normal appearance.  HENT:     Head: Normocephalic and atraumatic.     Right Ear: A middle ear effusion is present.     Mouth/Throat:     Mouth: Mucous membranes are moist.  Cardiovascular:     Rate and Rhythm: Normal rate.  Pulmonary:     Effort: Pulmonary effort is normal.  Skin:    General: Skin is warm.  Neurological:     General: No focal  deficit present.     Mental Status: She is alert.  Psychiatric:        Mood and Affect: Mood normal.      UC Treatments / Results  Labs (all labs ordered are listed, but only abnormal results are displayed) Labs Reviewed  SARS CORONAVIRUS 2 BY RT PCR    EKG   Radiology No results found.  Procedures Procedures (including critical care time)  Medications Ordered in UC Medications - No data to display  Initial Impression / Assessment and Plan / UC Course  I have reviewed the triage vital signs and the nursing notes.  Pertinent labs & imaging results that were available during my care of the patient were reviewed by me and considered in my medical decision making (see chart for details).   Final Clinical Impressions(s) / UC Diagnoses   Final diagnoses:  Dysfunction of right eustachian tube  Encounter for screening for COVID-19     Discharge Instructions      You were seen today for popping in the right ear.  This appears to be due to fluid behind the ear drum.  I recommend you take over the counter claritin or zyrtec, in addition to flonase nasal spray.  You have been swabbed for covid today.  This should be resulted later today and we will call you if positive.  Because you are pregnant it is not recommended to treat this with anti-virals.  However, you may wish to call your ob/gyn if needed.     ED Prescriptions   None    PDMP not reviewed this encounter.   Rondel Oh, MD 10/13/21 1028

## 2021-10-13 NOTE — Discharge Instructions (Signed)
You were seen today for popping in the right ear.  This appears to be due to fluid behind the ear drum.  I recommend you take over the counter claritin or zyrtec, in addition to flonase nasal spray.  You have been swabbed for covid today.  This should be resulted later today and we will call you if positive.  Because you are pregnant it is not recommended to treat this with anti-virals.  However, you may wish to call your ob/gyn if needed.

## 2021-10-21 ENCOUNTER — Encounter: Payer: Self-pay | Admitting: Family Medicine

## 2021-10-21 ENCOUNTER — Ambulatory Visit: Payer: BC Managed Care – PPO | Attending: Maternal & Fetal Medicine

## 2021-10-21 ENCOUNTER — Ambulatory Visit (INDEPENDENT_AMBULATORY_CARE_PROVIDER_SITE_OTHER): Payer: BC Managed Care – PPO | Admitting: Family Medicine

## 2021-10-21 ENCOUNTER — Ambulatory Visit: Payer: BC Managed Care – PPO | Admitting: *Deleted

## 2021-10-21 VITALS — BP 139/83 | HR 101

## 2021-10-21 VITALS — BP 156/98 | HR 128 | Wt 175.8 lb

## 2021-10-21 DIAGNOSIS — O10912 Unspecified pre-existing hypertension complicating pregnancy, second trimester: Secondary | ICD-10-CM

## 2021-10-21 DIAGNOSIS — O0992 Supervision of high risk pregnancy, unspecified, second trimester: Secondary | ICD-10-CM

## 2021-10-21 DIAGNOSIS — O34219 Maternal care for unspecified type scar from previous cesarean delivery: Secondary | ICD-10-CM

## 2021-10-21 DIAGNOSIS — O0990 Supervision of high risk pregnancy, unspecified, unspecified trimester: Secondary | ICD-10-CM | POA: Diagnosis present

## 2021-10-21 DIAGNOSIS — O368320 Maternal care for abnormalities of the fetal heart rate or rhythm, second trimester, not applicable or unspecified: Secondary | ICD-10-CM

## 2021-10-21 DIAGNOSIS — O09522 Supervision of elderly multigravida, second trimester: Secondary | ICD-10-CM | POA: Insufficient documentation

## 2021-10-21 DIAGNOSIS — Z3A23 23 weeks gestation of pregnancy: Secondary | ICD-10-CM

## 2021-10-21 DIAGNOSIS — H548 Legal blindness, as defined in USA: Secondary | ICD-10-CM

## 2021-10-21 DIAGNOSIS — Q150 Congenital glaucoma: Secondary | ICD-10-CM

## 2021-10-21 DIAGNOSIS — O10919 Unspecified pre-existing hypertension complicating pregnancy, unspecified trimester: Secondary | ICD-10-CM | POA: Insufficient documentation

## 2021-10-21 DIAGNOSIS — O09521 Supervision of elderly multigravida, first trimester: Secondary | ICD-10-CM

## 2021-10-21 DIAGNOSIS — Z3689 Encounter for other specified antenatal screening: Secondary | ICD-10-CM | POA: Diagnosis present

## 2021-10-21 DIAGNOSIS — O10012 Pre-existing essential hypertension complicating pregnancy, second trimester: Secondary | ICD-10-CM

## 2021-10-21 MED ORDER — NIFEDIPINE ER OSMOTIC RELEASE 30 MG PO TB24: 30.0000 mg | ORAL_TABLET | Freq: Every day | ORAL | 5 refills | Status: DC

## 2021-10-21 NOTE — Patient Instructions (Signed)

## 2021-10-21 NOTE — Progress Notes (Signed)
   Subjective:  Yesenia Velasquez is a 41 y.o. G2P1001 at 68w0dbeing seen today for ongoing prenatal care.  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  Patient reports  worsening blurry vision .  Contractions: Not present. Vag. Bleeding: None.  Movement: Present. Denies leaking of fluid.   The following portions of the patient's history were reviewed and updated as appropriate: allergies, current medications, past family history, past medical history, past social history, past surgical history and problem list. Problem list updated.  Objective:   Vitals:   10/21/21 1443  BP: (!) 156/98  Pulse: (!) 128  Weight: 175 lb 12.8 oz (79.7 kg)    Fetal Status: Fetal Heart Rate (bpm): 143   Movement: Present     General:  Alert, oriented and cooperative. Patient is in no acute distress.  Skin: Skin is warm and dry. No rash noted.   Cardiovascular: Normal heart rate noted  Respiratory: Normal respiratory effort, no problems with respiration noted  Abdomen: Soft, gravid, appropriate for gestational age. Pain/Pressure: Absent     Pelvic: Vag. Bleeding: None     Cervical exam deferred        Extremities: Normal range of motion.     Mental Status: Normal mood and affect. Normal behavior. Normal judgment and thought content.   Urinalysis:      Assessment and Plan:  Pregnancy: G2P1001 at 29w0d1. Supervision of high risk pregnancy, antepartum BP elevated, see below FHR normal Discussed fasting labs for next visit  2. Chronic hypertension affecting pregnancy BP elevated today Reports slightly blurrier vision, baseline can see  light/dark, shapes Denies headache, chest pain, SOB, RUQ pain, LE edema Given blurry vision is only symptoms do not think this qualifies as symptomatic PreE as this is a common pregnancy symptom Will recheck labs, start Nifedipine 30 XL daily, cont daily baby ASA Antenatal testing per MFM guidelines  3. AMA (advanced maternal age) multigravida 3559+first trimester Ante testing per guidelines Low risk NIPS  4. History of cesarean delivery affecting pregnancy Desires TOLAC, discuss at next visit  5. Legally blind   6. Congenital glaucoma   Preterm labor symptoms and general obstetric precautions including but not limited to vaginal bleeding, contractions, leaking of fluid and fetal movement were reviewed in detail with the patient. Please refer to After Visit Summary for other counseling recommendations.  Return in 4 weeks (on 11/18/2021) [facility].   EcClarnce FlockMD

## 2021-10-22 ENCOUNTER — Other Ambulatory Visit: Payer: Self-pay | Admitting: *Deleted

## 2021-10-22 DIAGNOSIS — Z3689 Encounter for other specified antenatal screening: Secondary | ICD-10-CM

## 2021-10-22 DIAGNOSIS — O10912 Unspecified pre-existing hypertension complicating pregnancy, second trimester: Secondary | ICD-10-CM

## 2021-10-22 DIAGNOSIS — O34219 Maternal care for unspecified type scar from previous cesarean delivery: Secondary | ICD-10-CM

## 2021-10-22 DIAGNOSIS — O09522 Supervision of elderly multigravida, second trimester: Secondary | ICD-10-CM

## 2021-10-22 LAB — COMPREHENSIVE METABOLIC PANEL
ALT: 18 IU/L (ref 0–32)
AST: 18 IU/L (ref 0–40)
Albumin/Globulin Ratio: 1.8 (ref 1.2–2.2)
Albumin: 4.3 g/dL (ref 3.9–4.9)
Alkaline Phosphatase: 66 IU/L (ref 44–121)
BUN/Creatinine Ratio: 9 (ref 9–23)
BUN: 7 mg/dL (ref 6–24)
Bilirubin Total: 0.3 mg/dL (ref 0.0–1.2)
CO2: 20 mmol/L (ref 20–29)
Calcium: 9.5 mg/dL (ref 8.7–10.2)
Chloride: 102 mmol/L (ref 96–106)
Creatinine, Ser: 0.76 mg/dL (ref 0.57–1.00)
Globulin, Total: 2.4 g/dL (ref 1.5–4.5)
Glucose: 96 mg/dL (ref 70–99)
Potassium: 3.9 mmol/L (ref 3.5–5.2)
Sodium: 138 mmol/L (ref 134–144)
Total Protein: 6.7 g/dL (ref 6.0–8.5)
eGFR: 101 mL/min/{1.73_m2} (ref >59)

## 2021-10-22 LAB — PROTEIN / CREATININE RATIO, URINE
Creatinine, Urine: 74.4 mg/dL
Protein, Ur: 10.7 mg/dL
Protein/Creat Ratio: 144 mg/g creat (ref 0–200)

## 2021-10-22 LAB — CBC
Hematocrit: 36.6 % (ref 34.0–46.6)
Hemoglobin: 12.7 g/dL (ref 11.1–15.9)
MCH: 30.5 pg (ref 26.6–33.0)
MCHC: 34.7 g/dL (ref 31.5–35.7)
MCV: 88 fL (ref 79–97)
Platelets: 193 10*3/uL (ref 150–450)
RBC: 4.16 x10E6/uL (ref 3.77–5.28)
RDW: 13.6 % (ref 11.7–15.4)
WBC: 9.1 10*3/uL (ref 3.4–10.8)

## 2021-10-28 ENCOUNTER — Ambulatory Visit (INDEPENDENT_AMBULATORY_CARE_PROVIDER_SITE_OTHER): Payer: BC Managed Care – PPO | Admitting: General Practice

## 2021-10-28 ENCOUNTER — Encounter: Payer: Self-pay | Admitting: General Practice

## 2021-10-28 VITALS — BP 138/82 | HR 127 | Ht 62.0 in | Wt 172.0 lb

## 2021-10-28 DIAGNOSIS — Z0130 Encounter for examination of blood pressure without abnormal findings: Secondary | ICD-10-CM

## 2021-10-28 NOTE — Progress Notes (Signed)
Patient presents to office today for blood pressure check following up from OB visit last week in which patient was placed on Nifedipine daily. Denies headaches or dizziness in the past week. FHR 160 bpm today & reports good fetal movement. BP 139/78 & 138/82. Patient reports feeling nervous/worried in office today because she is worried about her blood pressure. Reassured patient that we will adjust her meds as needed and labs when necessary. Reviewed how to check her blood pressure at home and enter into babyscripts. Patient will follow up 10/25 for OB visit or sooner if needed.  [name] RN BSN 10/28/21

## 2021-11-13 ENCOUNTER — Ambulatory Visit (INDEPENDENT_AMBULATORY_CARE_PROVIDER_SITE_OTHER): Payer: BC Managed Care – PPO | Admitting: Clinical

## 2021-11-13 DIAGNOSIS — Z658 Other specified problems related to psychosocial circumstances: Secondary | ICD-10-CM

## 2021-11-13 DIAGNOSIS — F4322 Adjustment disorder with anxiety: Secondary | ICD-10-CM | POA: Diagnosis not present

## 2021-11-13 NOTE — Telephone Encounter (Signed)
Called pt following MyChart request. Pt denies any s/s of elevated BP. Pt spoke with after hours RN who instructed her to go to hospital for BP check. Pt has BP cuff at home. Reports taking Nifedipine 30 mg daily at approx 7 AM as prescribed. Reviewed with Kennon Rounds MD who recommends pt check BP at home and log into Babyscripts. Will review BP and contact pt.   Pt reports increased anxiety over making healthy decisions during pregnancy. Has been limiting diet and caffeine intake but it is causing her to be anxious over everything she eats and drinks. Pt is very concerned about glucose test next week. Reassured patient she is doing a good job making healthy decisions, but not to limit completely. Offered [facility] visit to review coping strategies during this stressful time. Pt accepts and will see [facility] at 10:15 AM for consult.

## 2021-11-13 NOTE — Patient Instructions (Signed)
[facility] at [facility] [place], [place] [address] [phone] (main office) [phone] [facility] office)  [email or web address]   /Emotional Wellbeing Apps and Websites Here are a few free apps meant to help you to help yourself.  To find, try searching on the internet to see if the app is offered on Apple/Android devices. If your first choice doesn't come up on your device, the good news is that there are many choices! Play around with different apps to see which ones are helpful to you.    Calm This is an app meant to help increase calm feelings. Includes info, strategies, and tools for tracking your feelings.      Calm Harm  This app is meant to help with self-harm. Provides many 5-minute or 15-min coping strategies for doing instead of hurting yourself.       [place] is a problem-solving tool to help deal with emotions and cope with stress you encounter wherever you are.      MindShift This app can help people cope with anxiety. Rather than trying to avoid anxiety, you can make an important shift and face it.      MY3  MY3 features a support system, safety plan and resources with the goal of offering a tool to use in a time of need.       My Life My Voice  This mood journal offers a simple solution for tracking your thoughts, feelings and moods. Animated emoticons can help identify your mood.       Relax Melodies Designed to help with sleep, on this app you can mix sounds and meditations for relaxation.      Smiling Mind Smiling Mind is meditation made easy: it's a simple tool that helps put a smile on your mind.        Stop, Breathe & Think  A friendly, simple guide for people through meditations for mindfulness and compassion.  Stop, Breathe and Think Kids Enter your current feelings and choose a "mission" to help you cope. Offers videos for certain moods instead of just  sound recordings.       Team Orange The goal of this tool is to help teens change how they think, act, and react. This app helps you focus on your own good feelings and experiences.      The [place] Box The [place] Box (VHB) contains simple tools to help patients with coping, relaxation, distraction, and positive thinking.

## 2021-11-13 NOTE — BH Specialist Note (Signed)
Integrated Behavioral Health via Telemedicine Visit  11/13/2021 HLEE FRINGER [id number]  Number of [facility] Clinician visits: 1- Initial Visit  Session Start time: 5053   Session End time: 9767  Total time in minutes: 32   Referring Provider: Clayton Lefort, MD Patient/Family location: [facility] Provider location: [facility] for [place] [facility]  All persons participating in visit: Patient Soraya Paquette and [place]   Types of Service: Individual psychotherapy and Video visit  I connected with [name] and/or Theodoro Parma A Milliner's  n/a  via  Telephone or Video Enabled Telemedicine Application  (Video is Caregility application) and verified that I am speaking with the correct person using two identifiers. Discussed confidentiality: Yes   I discussed the limitations of telemedicine and the availability of in person appointments.  Discussed there is a possibility of technology failure and discussed alternative modes of communication if that failure occurs.  I discussed that engaging in this telemedicine visit, they consent to the provision of behavioral healthcare and the services will be billed under their insurance.  Patient and/or legal guardian expressed understanding and consented to Telemedicine visit: Yes   Presenting Concerns: Patient and/or family reports the following symptoms/concerns: Worry over upcoming childbirth; worry is affecting quality of sleep and poor appetite. Pt also worries about constipation and whether or not she will need to be out of work early due to high blood pressure. Husband and family supportive.  Duration of problem: Current pregnancy; Severity of problem: moderate  Patient and/or Family's Strengths/Protective Factors: Social connections, Concrete supports in place (healthy food, safe environments, etc.), Sense of purpose, and Physical Health (exercise, healthy diet, medication  compliance, etc.)  Goals Addressed: Patient will:  Reduce symptoms of: anxiety   Increase knowledge and/or ability of: self-management skills   Demonstrate ability to: Increase healthy adjustment to current life circumstances and Increase motivation to adhere to plan of care  Progress towards Goals: Ongoing  Interventions: Interventions utilized:  Mindfulness or Relaxation Training, Functional Assessment of ADLs, and Psychoeducation and/or Health Education Standardized Assessments completed: Not Needed  Patient and/or Family Response: Patient agrees with treatment plan.   Assessment: Patient currently experiencing Adjustment disorder with anxiety.   Patient may benefit from psychoeducation and brief therapeutic interventions regarding coping with symptoms of anxiety  .  Plan: Follow up with behavioral health clinician on : Three weeks Behavioral recommendations:  -Continue taking prenatal vitamin daily as prescribed -CALM relaxation breathing exercise twice daily (morning; at bedtime with sleep sounds); as needed throughout the day.  Referral(s): [place] (In Clinic)  I discussed the assessment and treatment plan with the patient and/or parent/guardian. They were provided an opportunity to ask questions and all were answered. They agreed with the plan and demonstrated an understanding of the instructions.   They were advised to call back or seek an in-person evaluation if the symptoms worsen or if the condition fails to improve as anticipated.  Garlan Fair, LCSW     09/07/2021    4:47 PM 08/03/2021    9:06 AM 04/16/2020    3:06 PM 04/11/2019    2:43 PM 11/28/2018    2:27 PM  Depression screen PHQ 2/9  Decreased Interest 0 3 0 0 0  Down, Depressed, Hopeless 0 0 0 0 0  PHQ - 2 Score 0 3 0 0 0  Altered sleeping 0 3  0   Tired, decreased energy 0 3  0   Change  in appetite 0 2  0   Feeling bad or failure about yourself  0 3  0   Trouble  concentrating 0 0  0   Moving slowly or fidgety/restless 0 0  0   Suicidal thoughts 0 0  0   PHQ-9 Score 0 14  0   Difficult doing work/chores    Not difficult at all        09/07/2021    4:47 PM 08/03/2021    9:07 AM  GAD 7 : Generalized Anxiety Score  Nervous, Anxious, on Edge 0 0  Control/stop worrying 0 0  Worry too much - different things 0 0  Trouble relaxing 0 0  Restless 0 0  Easily annoyed or irritable 0 0  Afraid - awful might happen 0 0  Total GAD 7 Score 0 0

## 2021-11-13 NOTE — Telephone Encounter (Signed)
Called pt to review concerns.

## 2021-11-18 ENCOUNTER — Ambulatory Visit (INDEPENDENT_AMBULATORY_CARE_PROVIDER_SITE_OTHER): Payer: BC Managed Care – PPO | Admitting: Obstetrics and Gynecology

## 2021-11-18 ENCOUNTER — Other Ambulatory Visit: Payer: Self-pay | Admitting: General Practice

## 2021-11-18 ENCOUNTER — Other Ambulatory Visit: Payer: BC Managed Care – PPO

## 2021-11-18 VITALS — BP 131/77 | HR 130 | Wt 163.9 lb

## 2021-11-18 DIAGNOSIS — O0990 Supervision of high risk pregnancy, unspecified, unspecified trimester: Secondary | ICD-10-CM

## 2021-11-18 DIAGNOSIS — O34219 Maternal care for unspecified type scar from previous cesarean delivery: Secondary | ICD-10-CM

## 2021-11-18 DIAGNOSIS — O10912 Unspecified pre-existing hypertension complicating pregnancy, second trimester: Secondary | ICD-10-CM

## 2021-11-18 DIAGNOSIS — Z3A27 27 weeks gestation of pregnancy: Secondary | ICD-10-CM

## 2021-11-18 DIAGNOSIS — O09521 Supervision of elderly multigravida, first trimester: Secondary | ICD-10-CM

## 2021-11-18 DIAGNOSIS — O10919 Unspecified pre-existing hypertension complicating pregnancy, unspecified trimester: Secondary | ICD-10-CM

## 2021-11-18 DIAGNOSIS — O09522 Supervision of elderly multigravida, second trimester: Secondary | ICD-10-CM

## 2021-11-18 DIAGNOSIS — H548 Legal blindness, as defined in USA: Secondary | ICD-10-CM

## 2021-11-18 DIAGNOSIS — O0992 Supervision of high risk pregnancy, unspecified, second trimester: Secondary | ICD-10-CM

## 2021-11-18 NOTE — Progress Notes (Signed)
   PRENATAL VISIT NOTE  Subjective:  Yesenia Velasquez is a 41 y.o. G2P1001 at 81w0dbeing seen today for ongoing prenatal care.  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  Patient doing well with no acute concerns today. She reports  some worsening of her vision, decreased appetite and trouble sleeping .  Contractions: Not present. Vag. Bleeding: None.  Movement: Present. Denies leaking of fluid.   The following portions of the patient's history were reviewed and updated as appropriate: allergies, current medications, past family history, past medical history, past social history, past surgical history and problem list. Problem list updated.  Objective:   Vitals:   11/18/21 1045  BP: 131/77  Pulse: (!) 130  Weight: 163 lb 14.4 oz (74.3 kg)    Fetal Status: Fetal Heart Rate (bpm): 145 Fundal Height: 27 cm Movement: Present     General:  Alert, oriented and cooperative. Patient is in no acute distress.  Skin: Skin is warm and dry. No rash noted.   Cardiovascular: Normal heart rate noted  Respiratory: Normal respiratory effort, no problems with respiration noted  Abdomen: Soft, gravid, appropriate for gestational age.  Pain/Pressure: Absent     Pelvic: Cervical exam deferred        Extremities: Normal range of motion.  Edema: None  Mental Status:  Normal mood and affect. Normal behavior. Normal judgment and thought content.   Assessment and Plan:  Pregnancy: G2P1001 at 252w0d1. [redacted] weeks gestation of pregnancy   2. Chronic hypertension affecting pregnancy Pt currently taking procardia with decent control, advised she  will need fetal testing starting at 32 weeks  3. Supervision of high risk pregnancy, antepartum Continue routine prenatal care Asked screening depression questions, most of which were negative.  Pt has valid pregnancy concerns but tends to "latch on to" them and cause increased stress/anxiety.  Pt reassured and was advised not to search out the negative aspects of her life.  Behavorial health specialist contacted and will follow up with the patient.  Pt does not wish to hurt herself or anyone else.  4. AMA (advanced maternal age) multigravida 3543+first trimester   5. History of cesarean delivery affecting pregnancy Pt has changed her mind and currently desires repeat c section with tubal ligation Pt needs to sign TOLAC versus c section consent  6. Legally blind Pt notes mild worsening of her vision, she has recently seen her ophthalmologist a few weeks again that noted her retina and vision was stable.  Pt advised to contact  her ophthalmologist if vision continues to worsen  Preterm labor symptoms and general obstetric precautions including but not limited to vaginal bleeding, contractions, leaking of fluid and fetal movement were reviewed in detail with the patient.  Please refer to After Visit Summary for other counseling recommendations.   Return in about 2 weeks (around 12/02/2021) [facility].   LaLynnda ShieldsMD Faculty Attending [facility]

## 2021-11-18 NOTE — Progress Notes (Signed)
Roselyn Reef contacted to come speak with pt

## 2021-11-19 ENCOUNTER — Ambulatory Visit: Payer: BC Managed Care – PPO | Admitting: Clinical

## 2021-11-19 LAB — CBC
Hematocrit: 36.3 % (ref 34.0–46.6)
Hemoglobin: 12.4 g/dL (ref 11.1–15.9)
MCH: 31.5 pg (ref 26.6–33.0)
MCHC: 34.2 g/dL (ref 31.5–35.7)
MCV: 92 fL (ref 79–97)
Platelets: 222 10*3/uL (ref 150–450)
RBC: 3.94 x10E6/uL (ref 3.77–5.28)
RDW: 12.8 % (ref 11.7–15.4)
WBC: 9 10*3/uL (ref 3.4–10.8)

## 2021-11-19 LAB — RPR: RPR Ser Ql: NONREACTIVE

## 2021-11-19 LAB — GLUCOSE TOLERANCE, 2 HOURS W/ 1HR
Glucose, 1 hour: 142 mg/dL (ref 70–179)
Glucose, 2 hour: 109 mg/dL (ref 70–152)
Glucose, Fasting: 78 mg/dL (ref 70–91)

## 2021-11-19 LAB — HIV ANTIBODY (ROUTINE TESTING W REFLEX): HIV Screen 4th Generation wRfx: NONREACTIVE

## 2021-11-19 NOTE — BH Specialist Note (Signed)
Pt declines visit today as she's currently at work; will call [phone] at a later time to schedule a follow up appointment.

## 2021-11-26 ENCOUNTER — Ambulatory Visit: Payer: BC Managed Care – PPO | Attending: Maternal & Fetal Medicine

## 2021-11-26 ENCOUNTER — Ambulatory Visit: Payer: BC Managed Care – PPO | Admitting: *Deleted

## 2021-11-26 VITALS — BP 135/80 | HR 124

## 2021-11-26 DIAGNOSIS — O0990 Supervision of high risk pregnancy, unspecified, unspecified trimester: Secondary | ICD-10-CM

## 2021-11-26 DIAGNOSIS — O368330 Maternal care for abnormalities of the fetal heart rate or rhythm, third trimester, not applicable or unspecified: Secondary | ICD-10-CM

## 2021-11-26 DIAGNOSIS — O09523 Supervision of elderly multigravida, third trimester: Secondary | ICD-10-CM | POA: Diagnosis not present

## 2021-11-26 DIAGNOSIS — O09521 Supervision of elderly multigravida, first trimester: Secondary | ICD-10-CM | POA: Diagnosis present

## 2021-11-26 DIAGNOSIS — O34219 Maternal care for unspecified type scar from previous cesarean delivery: Secondary | ICD-10-CM | POA: Diagnosis present

## 2021-11-26 DIAGNOSIS — O09522 Supervision of elderly multigravida, second trimester: Secondary | ICD-10-CM | POA: Diagnosis present

## 2021-11-26 DIAGNOSIS — O10912 Unspecified pre-existing hypertension complicating pregnancy, second trimester: Secondary | ICD-10-CM | POA: Insufficient documentation

## 2021-11-26 DIAGNOSIS — O10013 Pre-existing essential hypertension complicating pregnancy, third trimester: Secondary | ICD-10-CM | POA: Diagnosis not present

## 2021-11-26 DIAGNOSIS — Z3689 Encounter for other specified antenatal screening: Secondary | ICD-10-CM | POA: Insufficient documentation

## 2021-11-26 DIAGNOSIS — Z3A28 28 weeks gestation of pregnancy: Secondary | ICD-10-CM

## 2021-11-27 ENCOUNTER — Other Ambulatory Visit: Payer: Self-pay | Admitting: *Deleted

## 2021-11-27 DIAGNOSIS — O09523 Supervision of elderly multigravida, third trimester: Secondary | ICD-10-CM

## 2021-11-27 DIAGNOSIS — O10913 Unspecified pre-existing hypertension complicating pregnancy, third trimester: Secondary | ICD-10-CM

## 2021-12-03 ENCOUNTER — Telehealth: Payer: Self-pay

## 2021-12-03 NOTE — Telephone Encounter (Signed)
[place], Babyscripts  P [facility]-Babyscripts Htn Md; P [facility] Clinical Pool Babyscripts Trigger Notification for ITALIA, WOLFERT Trigger Severity: Elevated Blood Pressure Value: 136/93 Reading Date: 2021-12-02 Symptoms: No Symptoms  Babyscripts provider portal shows retake 20 minutes later that was 133/87. Pt not contacted.

## 2021-12-16 ENCOUNTER — Encounter: Payer: Self-pay | Admitting: General Practice

## 2021-12-16 ENCOUNTER — Ambulatory Visit (INDEPENDENT_AMBULATORY_CARE_PROVIDER_SITE_OTHER): Payer: BC Managed Care – PPO | Admitting: Obstetrics & Gynecology

## 2021-12-16 VITALS — BP 130/78 | HR 109 | Wt 168.0 lb

## 2021-12-16 DIAGNOSIS — O10919 Unspecified pre-existing hypertension complicating pregnancy, unspecified trimester: Secondary | ICD-10-CM

## 2021-12-16 DIAGNOSIS — Z23 Encounter for immunization: Secondary | ICD-10-CM | POA: Diagnosis not present

## 2021-12-16 DIAGNOSIS — O0993 Supervision of high risk pregnancy, unspecified, third trimester: Secondary | ICD-10-CM

## 2021-12-16 DIAGNOSIS — Z3A31 31 weeks gestation of pregnancy: Secondary | ICD-10-CM

## 2021-12-16 DIAGNOSIS — O10913 Unspecified pre-existing hypertension complicating pregnancy, third trimester: Secondary | ICD-10-CM

## 2021-12-16 DIAGNOSIS — O0990 Supervision of high risk pregnancy, unspecified, unspecified trimester: Secondary | ICD-10-CM

## 2021-12-16 DIAGNOSIS — O34219 Maternal care for unspecified type scar from previous cesarean delivery: Secondary | ICD-10-CM

## 2021-12-16 DIAGNOSIS — O09523 Supervision of elderly multigravida, third trimester: Secondary | ICD-10-CM

## 2021-12-16 NOTE — Patient Instructions (Signed)
Return to office for any scheduled appointments. Call the office or go to the [facility] at [facility] [place] if: You begin to have strong, frequent contractions Your water breaks.  Sometimes it is a big gush of fluid, sometimes it is just a trickle that keeps getting your underwear wet or running down your legs You have vaginal bleeding.  It is normal to have a small amount of spotting if your cervix was checked.  You do not feel your baby moving like normal.  If you do not, get something to eat and drink and lay down and focus on feeling your baby move.   If your baby is still not moving like normal, you should call the office or go to MAU. Any other obstetric concerns.  

## 2021-12-16 NOTE — Progress Notes (Signed)
PRENATAL VISIT NOTE  Subjective:  Yesenia Velasquez is a 41 y.o. G2P1001 at 59w0dbeing seen today for ongoing prenatal care.  She is currently monitored for the following issues for this high-risk pregnancy and has Essential hypertension; Supervision of high risk pregnancy, antepartum; AMA (advanced maternal age) multigravida 493+ third trimester; Chronic hypertension affecting pregnancy; History of cesarean delivery affecting pregnancy; Legally blind; Congenital glaucoma; and Rhegmatogenous retinal detachment on their problem list.  Patient reports no complaints.  Contractions: Not present. Vag. Bleeding: None.  Movement: Present. Denies leaking of fluid.   The following portions of the patient's history were reviewed and updated as appropriate: allergies, current medications, past family history, past medical history, past social history, past surgical history and problem list.   Objective:   Vitals:   12/16/21 1433 12/16/21 1441  BP: (!) 141/78 130/78  Pulse: (!) 113 (!) 109  Weight: 168 lb (76.2 kg)     Fetal Status: Fetal Heart Rate (bpm): 146   Movement: Present     General:  Alert, oriented and cooperative. Patient is in no acute distress.  Skin: Skin is warm and dry. No rash noted.   Cardiovascular: Normal heart rate noted  Respiratory: Normal respiratory effort, no problems with respiration noted  Abdomen: Soft, gravid, appropriate for gestational age.  Pain/Pressure: Present     Pelvic: Cervical exam deferred        Extremities: Normal range of motion.  Edema: None  Mental Status: Normal mood and affect. Normal behavior. Normal judgment and thought content.   [place]FM OB FOLLOW UP  Result Date: 11/26/2021 ----------------------------------------------------------------------  OBSTETRICS REPORT                       (Signed Final 11/26/2021 04:30 pm) ---------------------------------------------------------------------- Patient Info  ID #:       [id number]                          D.O.B.:  0Apr 05, 1982(41 yrs)  Name:       [name]                Visit Date: 11/26/2021 04:12 pm ---------------------------------------------------------------------- Performed By  Attending:        BValeda MalmDO       Secondary Phy.:   CAletha HalimMD  Performed By:     AEveline Keto        Address[address]          [address]  [address]  Referred By:      [facility]                                [facility] at                                                             [place] [facility]                                                             [facility]  Ref. Address:     [address]                    [place], [place] ---------------------------------------------------------------------- Orders  #  Description                           Code        Ordered By  1  [place] MFM OB FOLLOW UP                   50354.65    Sander Nephew ----------------------------------------------------------------------  #  Order #                     Accession #                Episode #  1  [id number]                   [id number]                 [id number] ---------------------------------------------------------------------- Indications  Fetal arrhythmia affecting pregnancy,          O36.8390  antepartum (PAC)  [redacted] weeks gestation of pregnancy                Z3A.65  Advanced maternal age multigravida 77+,        O20.522  second trimester (60 yrs)  Hypertension - Chronic/Pre-existing            O10.019  Low Risk NIPS(Negative AFP)(Negative  Horizon)  History of cesarean delivery, currently        O34.219  pregnant  Uterine fibroid  O34.10 ---------------------------------------------------------------------- Vital Signs  BP:          135/80 ---------------------------------------------------------------------- Fetal Evaluation  Num Of Fetuses:         1  Fetal Heart Rate(bpm):  159  Cardiac Activity:       Observed  Presentation:           Cephalic  Placenta:               Anterior  P. Cord Insertion:      Previously Visualized  Amniotic Fluid  AFI FV:      Within normal limits  AFI Sum(cm)     %Tile       Largest Pocket(cm)  13.3            39          4.8  RUQ(cm)       RLQ(cm)       LUQ(cm)        LLQ(cm)  1.8           3.2           4.8            3.5 ---------------------------------------------------------------------- Biometry  BPD:      70.8  mm     G. Age:  28w 3d         48  %    CI:         76.9   %    70 - 86                                                          FL/HC:      21.5   %    18.8 - 20.6  HC:      255.7  mm     G. Age:  27w 5d         12  %    HC/AC:      1.08        1.05 - 1.21  AC:      236.3  mm     G. Age:  28w 0d         36  %    FL/BPD:     77.5   %    71 - 87  FL:       54.9  mm     G. Age:  29w 0d         60  %    FL/AC:      23.2   %    20 - 24  LV:          2  mm  Est. FW:    1207  gm    2 lb 11 oz      43  % ---------------------------------------------------------------------- OB History  Gravidity:    2         Term:   1        Prem:   0        SAB:   0  TOP:          0       Ectopic:  0        Living: 1 ---------------------------------------------------------------------- Gestational Age  LMP:  28w 1d        Date:  05/13/21                  EDD:   02/17/22  U/S Today:     28w 2d                                        EDD:   02/16/22  Best:          28w 1d     Det. By:  LMP  (05/13/21)          EDD:   02/17/22 ---------------------------------------------------------------------- Anatomy  Cranium:               Appears normal         Diaphragm:              Appears normal  Cavum:                  Appears normal         Stomach:                Appears normal, left                                                                        sided  Ventricles:            Appears normal         Kidneys:                Appear normal  Heart:                 Appears normal         Bladder:                Appears normal                         (4CH, axis, and                         situs)  Other:  3VV and 3VTV visualized. ---------------------------------------------------------------------- Cervix Uterus Adnexa  Cervix  Not visualized (advanced GA >24wks)  Uterus  Single fibroid previously seen, see table below.  Right Ovary  Not visualized.  Left Ovary  Not visualized. ---------------------------------------------------------------------- Myomas  Site                     L(cm)      W(cm)      D(cm)       Location  Right                    2.5        1.7        2 ----------------------------------------------------------------------  Blood Flow                  RI       PI       Comments  Prev seen ---------------------------------------------------------------------- Comments  Follow-up growth ultrasound at 28w 1d with EDD of  02/17/2022 dated by LMP  (05/13/21). Pregnancy  complicated by prior fetal PACs (resolved), AMA, CHTN  (Procardia) and uterine fibroids.  Sonographic findings  Single intrauterine pregnancy at 28w 1d.  Observed fetal cardiac activity.  Cephalic presentation.  Interval fetal anatomy appears normal. No arrythmias were  seen today.  Fetal biometry shows the estimated fetal weight at the 43  percentile.  Amniotic fluid volume: Within normal limits. AFI: 13.3 cm.  MVP: 4.8 cm.  Placenta: Anterior.  Recommendations  1. Serial growth ultrasounds every 4 weeks until delivery  2. Antenatal testing to start around 32 weeks (either weekly  BPP or twice weekly NST with weekly AFI)  3. Delivery around 37-[redacted] weeks gestation  4. Fetal echo is only indicated with  significant persistent  arrythmias. This is not indicated at this time. ----------------------------------------------------------------------                  Valeda Malm, DO Electronically Signed Final Report   11/26/2021 04:30 pm ----------------------------------------------------------------------   Assessment and Plan:  Pregnancy: G2P1001 at 63w0d1. Chronic hypertension affecting pregnancy Stable BP on medications.  Continue antenatal testing and ultrasounds as per MFM and follow MFM recommendations.   2. History of cesarean delivery affecting pregnancy Desires repeat cesarean section and BTS, TOLAC consent signed indicating desire for RCS. Surgical request sent for RCS and BTS at 39 weeks.  3. [redacted] weeks gestation of pregnancy 4. AMA (advanced maternal age) multigravida 474+ third trimester 5. Supervision of high risk pregnancy, antepartum Preterm labor symptoms and general obstetric precautions including but not limited to vaginal bleeding, contractions, leaking of fluid and fetal movement were reviewed in detail with the patient. Please refer to After Visit Summary for other counseling recommendations.   Return in about 2 weeks (around 12/30/2021) for OFFICE OB VISIT (MD only).  Future Appointments  Date Time Provider DBriscoe 12/24/2021  2:15 [facility] 12/24/2021  2:30 PM [facility]/31/2021  3:15 PM [facility] NURSE [facility] 12/31/2021  3:30 PM [facility] 01/07/2022  3:15 PM [facility]UR[facility] 01/07/2022  3:30 PM [facility] US[facility] 01/14/2022  3:15 PM [facility]URSE [facility] 01/14/2022  3:30 PM WMC-MFC US2 WMC-MFCUS WSudan   UVerita Schneiders MD

## 2021-12-21 NOTE — Addendum Note (Signed)
Addended by: Forrestine Him A on: 12/21/2021 08:09 AM   Modules accepted: Orders

## 2021-12-22 ENCOUNTER — Other Ambulatory Visit: Payer: Self-pay | Admitting: Obstetrics and Gynecology

## 2021-12-22 DIAGNOSIS — O0990 Supervision of high risk pregnancy, unspecified, unspecified trimester: Secondary | ICD-10-CM

## 2021-12-24 ENCOUNTER — Ambulatory Visit: Payer: BC Managed Care – PPO | Attending: Maternal & Fetal Medicine

## 2021-12-24 ENCOUNTER — Ambulatory Visit: Payer: BC Managed Care – PPO | Admitting: *Deleted

## 2021-12-24 VITALS — BP 152/85 | HR 111

## 2021-12-24 DIAGNOSIS — O3413 Maternal care for benign tumor of corpus uteri, third trimester: Secondary | ICD-10-CM

## 2021-12-24 DIAGNOSIS — O09523 Supervision of elderly multigravida, third trimester: Secondary | ICD-10-CM

## 2021-12-24 DIAGNOSIS — O0990 Supervision of high risk pregnancy, unspecified, unspecified trimester: Secondary | ICD-10-CM | POA: Diagnosis present

## 2021-12-24 DIAGNOSIS — O368390 Maternal care for abnormalities of the fetal heart rate or rhythm, unspecified trimester, not applicable or unspecified: Secondary | ICD-10-CM

## 2021-12-24 DIAGNOSIS — Z3A32 32 weeks gestation of pregnancy: Secondary | ICD-10-CM

## 2021-12-24 DIAGNOSIS — O10013 Pre-existing essential hypertension complicating pregnancy, third trimester: Secondary | ICD-10-CM | POA: Diagnosis not present

## 2021-12-24 DIAGNOSIS — O10913 Unspecified pre-existing hypertension complicating pregnancy, third trimester: Secondary | ICD-10-CM | POA: Insufficient documentation

## 2021-12-24 DIAGNOSIS — O34219 Maternal care for unspecified type scar from previous cesarean delivery: Secondary | ICD-10-CM

## 2021-12-24 DIAGNOSIS — D259 Leiomyoma of uterus, unspecified: Secondary | ICD-10-CM

## 2021-12-31 ENCOUNTER — Ambulatory Visit: Payer: BC Managed Care – PPO | Attending: Maternal & Fetal Medicine

## 2021-12-31 ENCOUNTER — Ambulatory Visit: Payer: BC Managed Care – PPO | Admitting: *Deleted

## 2021-12-31 VITALS — BP 134/80 | HR 113

## 2021-12-31 DIAGNOSIS — O09522 Supervision of elderly multigravida, second trimester: Secondary | ICD-10-CM

## 2021-12-31 DIAGNOSIS — O0990 Supervision of high risk pregnancy, unspecified, unspecified trimester: Secondary | ICD-10-CM

## 2021-12-31 DIAGNOSIS — O34219 Maternal care for unspecified type scar from previous cesarean delivery: Secondary | ICD-10-CM

## 2021-12-31 DIAGNOSIS — O09523 Supervision of elderly multigravida, third trimester: Secondary | ICD-10-CM | POA: Diagnosis present

## 2021-12-31 DIAGNOSIS — O368390 Maternal care for abnormalities of the fetal heart rate or rhythm, unspecified trimester, not applicable or unspecified: Secondary | ICD-10-CM | POA: Diagnosis not present

## 2021-12-31 DIAGNOSIS — Z3A33 33 weeks gestation of pregnancy: Secondary | ICD-10-CM

## 2021-12-31 DIAGNOSIS — O10013 Pre-existing essential hypertension complicating pregnancy, third trimester: Secondary | ICD-10-CM | POA: Diagnosis not present

## 2021-12-31 DIAGNOSIS — O3413 Maternal care for benign tumor of corpus uteri, third trimester: Secondary | ICD-10-CM

## 2021-12-31 DIAGNOSIS — D259 Leiomyoma of uterus, unspecified: Secondary | ICD-10-CM

## 2021-12-31 DIAGNOSIS — O10913 Unspecified pre-existing hypertension complicating pregnancy, third trimester: Secondary | ICD-10-CM

## 2022-01-07 ENCOUNTER — Ambulatory Visit: Payer: BC Managed Care – PPO | Attending: Maternal & Fetal Medicine

## 2022-01-07 ENCOUNTER — Ambulatory Visit (INDEPENDENT_AMBULATORY_CARE_PROVIDER_SITE_OTHER): Payer: BC Managed Care – PPO | Admitting: Obstetrics and Gynecology

## 2022-01-07 ENCOUNTER — Ambulatory Visit: Payer: BC Managed Care – PPO | Admitting: *Deleted

## 2022-01-07 VITALS — BP 127/85 | HR 102

## 2022-01-07 VITALS — BP 119/82 | HR 114 | Wt 166.5 lb

## 2022-01-07 DIAGNOSIS — O368330 Maternal care for abnormalities of the fetal heart rate or rhythm, third trimester, not applicable or unspecified: Secondary | ICD-10-CM

## 2022-01-07 DIAGNOSIS — O10919 Unspecified pre-existing hypertension complicating pregnancy, unspecified trimester: Secondary | ICD-10-CM

## 2022-01-07 DIAGNOSIS — Z3A34 34 weeks gestation of pregnancy: Secondary | ICD-10-CM

## 2022-01-07 DIAGNOSIS — D259 Leiomyoma of uterus, unspecified: Secondary | ICD-10-CM

## 2022-01-07 DIAGNOSIS — O09523 Supervision of elderly multigravida, third trimester: Secondary | ICD-10-CM | POA: Diagnosis present

## 2022-01-07 DIAGNOSIS — O0990 Supervision of high risk pregnancy, unspecified, unspecified trimester: Secondary | ICD-10-CM | POA: Diagnosis present

## 2022-01-07 DIAGNOSIS — O10913 Unspecified pre-existing hypertension complicating pregnancy, third trimester: Secondary | ICD-10-CM | POA: Insufficient documentation

## 2022-01-07 DIAGNOSIS — O0993 Supervision of high risk pregnancy, unspecified, third trimester: Secondary | ICD-10-CM

## 2022-01-07 DIAGNOSIS — O3413 Maternal care for benign tumor of corpus uteri, third trimester: Secondary | ICD-10-CM

## 2022-01-07 DIAGNOSIS — Z23 Encounter for immunization: Secondary | ICD-10-CM

## 2022-01-07 DIAGNOSIS — O10013 Pre-existing essential hypertension complicating pregnancy, third trimester: Secondary | ICD-10-CM | POA: Diagnosis not present

## 2022-01-07 DIAGNOSIS — O34219 Maternal care for unspecified type scar from previous cesarean delivery: Secondary | ICD-10-CM

## 2022-01-07 DIAGNOSIS — H548 Legal blindness, as defined in USA: Secondary | ICD-10-CM

## 2022-01-07 NOTE — Progress Notes (Signed)
   PRENATAL VISIT NOTE  Subjective:  Yesenia Velasquez is a 41 y.o. G2P1001 at 58w1dbeing seen today for ongoing prenatal care.  She is currently monitored for the following issues for this high-risk pregnancy and has Essential hypertension; Supervision of high risk pregnancy, antepartum; AMA (advanced maternal age) multigravida 468+ third trimester; Chronic hypertension affecting pregnancy; History of cesarean delivery affecting pregnancy; Legally blind; Congenital glaucoma; and Rhegmatogenous retinal detachment on their problem list.  Patient doing well with no acute concerns today. She reports  concerns about having another baby and is worried .  Contractions: Not present. Vag. Bleeding: None.  Movement: Present. Denies leaking of fluid.   The following portions of the patient's history were reviewed and updated as appropriate: allergies, current medications, past family history, past medical history, past social history, past surgical history and problem list. Problem list updated.  Objective:   Vitals:   01/07/22 1445  BP: 119/82  Pulse: (!) 114  Weight: 166 lb 8 oz (75.5 kg)    Fetal Status: Fetal Heart Rate (bpm): 155 Fundal Height: 35 cm Movement: Present     General:  Alert, oriented and cooperative. Patient is in no acute distress.  Skin: Skin is warm and dry. No rash noted.   Cardiovascular: Normal heart rate noted  Respiratory: Normal respiratory effort, no problems with respiration noted  Abdomen: Soft, gravid, appropriate for gestational age.  Pain/Pressure: Absent     Pelvic: Cervical exam deferred        Extremities: Normal range of motion.  Edema: None  Mental Status:  Normal mood and affect. Normal behavior. Normal judgment and thought content.   Assessment and Plan:  Pregnancy: G2P1001 at 33w1d1. Supervision of high risk pregnancy, antepartum Continue routine prenatal care - Flu Vaccine QUAD 36+ mos IM (Fluarix, Quad PF)  2. Flu vaccine need  - Flu Vaccine  QUAD 36+ mos IM (Fluarix, Quad PF)  3. [redacted] weeks gestation of pregnancy   4. Chronic hypertension affecting pregnancy BP well controlled on current regimen Pt has BPP today 5. Legally blind   6. History of cesarean delivery affecting pregnancy Pt desires repeat c section, currently at 39 weeks  7. AMA (advanced maternal age) multigravida 4078+third trimester   Preterm labor symptoms and general obstetric precautions including but not limited to vaginal bleeding, contractions, leaking of fluid and fetal movement were reviewed in detail with the patient.  Please refer to After Visit Summary for other counseling recommendations.   Return in about 2 weeks (around 01/21/2022) [facility] 36 weeks swabs.   LaLynnda ShieldsMD Faculty Attending Center for [facility]

## 2022-01-08 ENCOUNTER — Other Ambulatory Visit: Payer: Self-pay | Admitting: *Deleted

## 2022-01-08 DIAGNOSIS — O10913 Unspecified pre-existing hypertension complicating pregnancy, third trimester: Secondary | ICD-10-CM

## 2022-01-08 DIAGNOSIS — O09523 Supervision of elderly multigravida, third trimester: Secondary | ICD-10-CM

## 2022-01-14 ENCOUNTER — Ambulatory Visit: Payer: BC Managed Care – PPO | Admitting: *Deleted

## 2022-01-14 ENCOUNTER — Other Ambulatory Visit: Payer: Self-pay | Admitting: Maternal & Fetal Medicine

## 2022-01-14 ENCOUNTER — Ambulatory Visit: Payer: BC Managed Care – PPO | Attending: Maternal & Fetal Medicine

## 2022-01-14 VITALS — BP 121/78 | HR 117

## 2022-01-14 DIAGNOSIS — O09523 Supervision of elderly multigravida, third trimester: Secondary | ICD-10-CM | POA: Diagnosis present

## 2022-01-14 DIAGNOSIS — O10013 Pre-existing essential hypertension complicating pregnancy, third trimester: Secondary | ICD-10-CM | POA: Diagnosis not present

## 2022-01-14 DIAGNOSIS — O34219 Maternal care for unspecified type scar from previous cesarean delivery: Secondary | ICD-10-CM | POA: Diagnosis not present

## 2022-01-14 DIAGNOSIS — D259 Leiomyoma of uterus, unspecified: Secondary | ICD-10-CM

## 2022-01-14 DIAGNOSIS — O368330 Maternal care for abnormalities of the fetal heart rate or rhythm, third trimester, not applicable or unspecified: Secondary | ICD-10-CM | POA: Diagnosis not present

## 2022-01-14 DIAGNOSIS — O10913 Unspecified pre-existing hypertension complicating pregnancy, third trimester: Secondary | ICD-10-CM

## 2022-01-14 DIAGNOSIS — Z3A35 35 weeks gestation of pregnancy: Secondary | ICD-10-CM

## 2022-01-14 DIAGNOSIS — O3413 Maternal care for benign tumor of corpus uteri, third trimester: Secondary | ICD-10-CM

## 2022-01-14 NOTE — Procedures (Signed)
Yesenia Velasquez 1980/05/31 [redacted]w[redacted]d Fetus A Non-Stress Test Interpretation for 01/14/22  Indication: Advanced Maternal Age >40 years and Unsatisfactory BPP  Fetal Heart Rate A Mode: External Baseline Rate (A): 130 bpm Variability: Moderate Accelerations: 15 x 15 Decelerations: None Multiple birth?: No  Uterine Activity Mode: Toco Contraction Frequency (min): 2-10 Contraction Duration (sec): 40-60 Contraction Quality: Mild Resting Tone Palpated: Relaxed Resting Time: Adequate  Interpretation (Fetal Testing) Nonstress Test Interpretation: Reactive Overall Impression: Reassuring for gestational age Comments: tracing reviewed by Dr SEpimenio Sarin

## 2022-01-20 ENCOUNTER — Ambulatory Visit: Payer: BC Managed Care – PPO | Admitting: *Deleted

## 2022-01-20 ENCOUNTER — Other Ambulatory Visit: Payer: Self-pay | Admitting: Obstetrics and Gynecology

## 2022-01-20 ENCOUNTER — Ambulatory Visit: Payer: BC Managed Care – PPO | Attending: Obstetrics and Gynecology

## 2022-01-20 VITALS — BP 123/84 | HR 114

## 2022-01-20 DIAGNOSIS — O09523 Supervision of elderly multigravida, third trimester: Secondary | ICD-10-CM

## 2022-01-20 DIAGNOSIS — O34219 Maternal care for unspecified type scar from previous cesarean delivery: Secondary | ICD-10-CM

## 2022-01-20 DIAGNOSIS — O0990 Supervision of high risk pregnancy, unspecified, unspecified trimester: Secondary | ICD-10-CM | POA: Diagnosis present

## 2022-01-20 DIAGNOSIS — O10913 Unspecified pre-existing hypertension complicating pregnancy, third trimester: Secondary | ICD-10-CM | POA: Insufficient documentation

## 2022-01-20 DIAGNOSIS — O3413 Maternal care for benign tumor of corpus uteri, third trimester: Secondary | ICD-10-CM | POA: Diagnosis not present

## 2022-01-20 DIAGNOSIS — O368330 Maternal care for abnormalities of the fetal heart rate or rhythm, third trimester, not applicable or unspecified: Secondary | ICD-10-CM

## 2022-01-20 DIAGNOSIS — O283 Abnormal ultrasonic finding on antenatal screening of mother: Secondary | ICD-10-CM

## 2022-01-20 DIAGNOSIS — O10013 Pre-existing essential hypertension complicating pregnancy, third trimester: Secondary | ICD-10-CM

## 2022-01-20 DIAGNOSIS — D259 Leiomyoma of uterus, unspecified: Secondary | ICD-10-CM

## 2022-01-20 DIAGNOSIS — Z3A36 36 weeks gestation of pregnancy: Secondary | ICD-10-CM

## 2022-01-20 NOTE — Procedures (Signed)
Yesenia Velasquez [birth year] [redacted]w[redacted]d Fetus A Non-Stress Test Interpretation for 01/20/22  Indication: Unsatisfactory BPP  Fetal Heart Rate A Mode: External Baseline Rate (A): 130 bpm Variability: Moderate Accelerations: 15 x 15 Decelerations: None Multiple birth?: No  Uterine Activity Mode: Palpation, Toco Contraction Frequency (min): none Resting Tone Palpated: Relaxed  Interpretation (Fetal Testing) Nonstress Test Interpretation: Reactive Overall Impression: Reassuring for gestational age Comments: Dr. SDonalee Citrinreviewd tracing

## 2022-01-21 ENCOUNTER — Ambulatory Visit (INDEPENDENT_AMBULATORY_CARE_PROVIDER_SITE_OTHER): Payer: BC Managed Care – PPO | Admitting: Obstetrics and Gynecology

## 2022-01-21 ENCOUNTER — Other Ambulatory Visit (HOSPITAL_COMMUNITY)
Admission: RE | Admit: 2022-01-21 | Discharge: 2022-01-21 | Disposition: A | Discharge status: Home / Self Care | Payer: BC Managed Care – PPO | Source: Ambulatory Visit | Attending: Obstetrics and Gynecology | Admitting: Obstetrics and Gynecology

## 2022-01-21 VITALS — BP 126/80 | HR 114 | Wt 165.0 lb

## 2022-01-21 DIAGNOSIS — O10919 Unspecified pre-existing hypertension complicating pregnancy, unspecified trimester: Secondary | ICD-10-CM

## 2022-01-21 DIAGNOSIS — F32A Depression, unspecified: Secondary | ICD-10-CM

## 2022-01-21 DIAGNOSIS — H548 Legal blindness, as defined in USA: Secondary | ICD-10-CM

## 2022-01-21 DIAGNOSIS — F419 Anxiety disorder, unspecified: Secondary | ICD-10-CM

## 2022-01-21 DIAGNOSIS — O09523 Supervision of elderly multigravida, third trimester: Secondary | ICD-10-CM

## 2022-01-21 DIAGNOSIS — O34219 Maternal care for unspecified type scar from previous cesarean delivery: Secondary | ICD-10-CM

## 2022-01-21 DIAGNOSIS — O0990 Supervision of high risk pregnancy, unspecified, unspecified trimester: Secondary | ICD-10-CM | POA: Insufficient documentation

## 2022-01-21 DIAGNOSIS — O10913 Unspecified pre-existing hypertension complicating pregnancy, third trimester: Secondary | ICD-10-CM

## 2022-01-21 DIAGNOSIS — O0993 Supervision of high risk pregnancy, unspecified, third trimester: Secondary | ICD-10-CM

## 2022-01-21 DIAGNOSIS — Z3A36 36 weeks gestation of pregnancy: Secondary | ICD-10-CM

## 2022-01-21 NOTE — Progress Notes (Signed)
   PRENATAL VISIT NOTE  Subjective:  Yesenia Velasquez is a 41 y.o. G2P1001 at 88w1dbeing seen today for ongoing prenatal care.  She is currently monitored for the following issues for this high-risk pregnancy and has Essential hypertension; Supervision of high risk pregnancy, antepartum; AMA (advanced maternal age) multigravida 482+ third trimester; Chronic hypertension affecting pregnancy; History of cesarean delivery affecting pregnancy; Legally blind; Congenital glaucoma; and Rhegmatogenous retinal detachment on their problem list.  Patient reports  anxiety .  Contractions: Not present. Vag. Bleeding: None.  Movement: Present. Denies leaking of fluid.   The following portions of the patient's history were reviewed and updated as appropriate: allergies, current medications, past family history, past medical history, past social history, past surgical history and problem list.   Objective:   Vitals:   01/21/22 1455  BP: 126/80  Pulse: (!) 114  Weight: 165 lb (74.8 kg)    Fetal Status: Fetal Heart Rate (bpm): 134   Movement: Present  Presentation: Vertex  General:  Alert, oriented and cooperative. Patient is in no acute distress.  Skin: Skin is warm and dry. No rash noted.   Cardiovascular: Normal heart rate noted  Respiratory: Normal respiratory effort, no problems with respiration noted  Abdomen: Soft, gravid, appropriate for gestational age.  Pain/Pressure: Absent     Pelvic: Cervical exam performed in the presence of a chaperone Dilation: Closed Effacement (%): 0    Extremities: Normal range of motion.  Edema: None  Mental Status: Normal mood and affect. Normal behavior. Normal judgment and thought content.   Assessment and Plan:  Pregnancy: G2P1001 at 392w1d. Supervision of high risk pregnancy, antepartum For BTL - Culture, beta strep (group b only) - Cervicovaginal ancillary only( COBenedict 2. Anxiety and depression Patient reports under a lot of stress primarily with  FOB. She has spoke to JaDexternd currently not interested in repeat talk. Strategies d/w her but she would like to move up her c-section date from the 17th; mfm said that patient can be delivered at 38wks. I d/w her small risk of fetal lung immaturity at 38wks and stressors not necessarily relieved with earlier delivery and stressed importance of talking with counseling. Pt has good support at home with her mother, who is here with her and the patient said it's okay for her mother to talk to JaRoselyn Reefmessage sent)  Pt would like earlier delivery date. Request sent for 1/10 rpt c/s and BTL  3. Chronic hypertension affecting pregnancy Doing well on procardia 30 qday and low dose asa Set up to continue qwk scans See above 12/27: 62%, 2919g, ac 81%, afi 13, bpp 8/10  4. AMA (advanced maternal age) multigravida 4032+third trimester  5. Legally blind  6. History of cesarean delivery affecting pregnancy See above  Term labor symptoms and general obstetric precautions including but not limited to vaginal bleeding, contractions, leaking of fluid and fetal movement were reviewed in detail with the patient. Please refer to After Visit Summary for other counseling recommendations.   No follow-ups on file.  Future Appointments  Date Time Provider DeLincolnshire1/04/2022  1:15 PM WMNorthwest Endo [facility] LLCST WMMagnolia [facility] LLCMO[facility]1/04/2022  2:35 PM EcClarnce FlockMD WMMdsine LLCMHastings [facility]1/11/2022  2:35 PM PiAletha HalimMD WMAcadiana Surgery Center IncMThe [facility]1/25/2024  2:35 PM PrDonnamae JudeMD WMUniversity Surgery CenterMCh[facility]1/25/2024  3:15 PM WMC-WOCA NST [facility] LLCM[facility]  ChAletha HalimMD

## 2022-01-21 NOTE — Progress Notes (Signed)
Patient here for routine prenatal check up.   Patient's mother complains that Yesenia Velasquez has been having anxiety and unable to sleep

## 2022-01-21 NOTE — Progress Notes (Deleted)
   PRENATAL VISIT NOTE  Subjective:  Yesenia Velasquez is a 41 y.o. G2P1001 at 33w1dbeing seen today for ongoing prenatal care.  She is currently monitored for the following issues for this {Blank single:19197::"high-risk","low-risk"} pregnancy and has Essential hypertension; Supervision of high risk pregnancy, antepartum; AMA (advanced maternal age) multigravida 4107+ third trimester; Chronic hypertension affecting pregnancy; History of cesarean delivery affecting pregnancy; Legally blind; Congenital glaucoma; and Rhegmatogenous retinal detachment on their problem list.  Patient reports {sx:14538}.  Contractions: Not present. Vag. Bleeding: None.  Movement: Present. Denies leaking of fluid.   The following portions of the patient's history were reviewed and updated as appropriate: allergies, current medications, past family history, past medical history, past social history, past surgical history and problem list.   Objective:   Vitals:   01/21/22 1455  BP: 126/80  Pulse: (!) 114  Weight: 165 lb (74.8 kg)    Fetal Status: Fetal Heart Rate (bpm): 134   Movement: Present     General:  Alert, oriented and cooperative. Patient is in no acute distress.  Skin: Skin is warm and dry. No rash noted.   Cardiovascular: Normal heart rate noted  Respiratory: Normal respiratory effort, no problems with respiration noted  Abdomen: Soft, gravid, appropriate for gestational age.  Pain/Pressure: Absent     Pelvic: {Blank single:19197::"Cervical exam performed in the presence of a chaperone","Cervical exam deferred"}        Extremities: Normal range of motion.  Edema: None  Mental Status: Normal mood and affect. Normal behavior. Normal judgment and thought content.   Assessment and Plan:  Pregnancy: G2P1001 at [redacted]w[redacted]d. Supervision of high risk pregnancy, antepartum *** - Culture, beta strep (group b only) - Cervicovaginal ancillary only( CONoble 2. Anxiety and depression ***  3. Chronic  hypertension affecting pregnancy ***  4. AMA (advanced maternal age) multigravida 4027+third trimester ***  5. Legally blind ***  6. History of cesarean delivery affecting pregnancy ***  {Blank single:19197::"Term","Preterm"} labor symptoms and general obstetric precautions including but not limited to vaginal bleeding, contractions, leaking of fluid and fetal movement were reviewed in detail with the patient. Please refer to After Visit Summary for other counseling recommendations.   No follow-ups on file.  Future Appointments  Date Time Provider DeMidway1/04/2022  1:15 PM WMCypress Outpatient Surgical Center IncST WMSwedish Medical [facility]1/04/2022  2:35 PM EcClarnce FlockMD WMHigh[facility]-Cl[facility] IncMTreg[facility]1/11/2022  2:35 PM PiAletha HalimMD WMZion Eye Institute IncMD[facility]1/25/2024  2:35 PM PrDonnamae JudeMD WMSabetha Community HospitalMMethodist [facility]1/25/2024  3:15 PM WMC-WOCA NST WMCentennial Peaks HospitalMOceans [facility]  ChAletha HalimMD

## 2022-01-22 ENCOUNTER — Telehealth: Payer: Self-pay | Admitting: *Deleted

## 2022-01-22 LAB — CERVICOVAGINAL ANCILLARY ONLY
Chlamydia: NEGATIVE
Comment: NEGATIVE
Comment: NORMAL
Neisseria Gonorrhea: NEGATIVE

## 2022-01-22 NOTE — Telephone Encounter (Signed)
Patient notified of new surgery date/time.  Instructions given.   02/03/22 at 12:30pm .  To arrive at entrance C [facility] at 10:30am.

## 2022-01-25 LAB — CULTURE, BETA STREP (GROUP B ONLY): Strep Gp B Culture: NEGATIVE

## 2022-01-27 ENCOUNTER — Encounter (HOSPITAL_COMMUNITY): Payer: Self-pay

## 2022-01-27 NOTE — Patient Instructions (Signed)
SHAMA MONFILS  01/27/2022   Your procedure is scheduled on:  02/03/2022  Arrive at 10 at Entrance C on [address] [facility]  and [address]. You are invited to use the FREE valet parking or use the Visitor's parking deck.  Pick up the phone at the desk and dial [phone].  Call this number if you have problems the morning of surgery: [phone]  Remember:   Do not eat food:(After Midnight) Desps de medianoche.  Do not drink clear liquids: (After Midnight) Desps de medianoche.  Take these medicines the morning of surgery with A SIP OF WATER:  Nifedipine as prescribed   Do not wear jewelry, make-up or nail polish.  Do not wear lotions, powders, or perfumes. Do not wear deodorant.  Do not shave 48 hours prior to surgery.  Do not bring valuables to the hospital.  [facility] is not   responsible for any belongings or valuables brought to the hospital.  Contacts, dentures or bridgework may not be worn into surgery.  Leave suitcase in the car. After surgery it may be brought to your room.  For patients admitted to the hospital, checkout time is 11:00 AM the day of              discharge.      Please read over the following fact sheets that you were given:     Preparing for Surgery

## 2022-01-28 ENCOUNTER — Ambulatory Visit: Payer: BC Managed Care – PPO | Admitting: General Practice

## 2022-01-28 ENCOUNTER — Ambulatory Visit (INDEPENDENT_AMBULATORY_CARE_PROVIDER_SITE_OTHER): Payer: BC Managed Care – PPO | Admitting: Family Medicine

## 2022-01-28 ENCOUNTER — Encounter: Payer: Self-pay | Admitting: Family Medicine

## 2022-01-28 ENCOUNTER — Ambulatory Visit (INDEPENDENT_AMBULATORY_CARE_PROVIDER_SITE_OTHER): Payer: BC Managed Care – PPO

## 2022-01-28 VITALS — BP 127/85 | HR 121 | Wt 163.2 lb

## 2022-01-28 DIAGNOSIS — O09523 Supervision of elderly multigravida, third trimester: Secondary | ICD-10-CM | POA: Diagnosis not present

## 2022-01-28 DIAGNOSIS — O0990 Supervision of high risk pregnancy, unspecified, unspecified trimester: Secondary | ICD-10-CM

## 2022-01-28 DIAGNOSIS — H548 Legal blindness, as defined in USA: Secondary | ICD-10-CM

## 2022-01-28 DIAGNOSIS — O34219 Maternal care for unspecified type scar from previous cesarean delivery: Secondary | ICD-10-CM

## 2022-01-28 DIAGNOSIS — Z3A37 37 weeks gestation of pregnancy: Secondary | ICD-10-CM | POA: Diagnosis not present

## 2022-01-28 DIAGNOSIS — O10919 Unspecified pre-existing hypertension complicating pregnancy, unspecified trimester: Secondary | ICD-10-CM

## 2022-01-28 DIAGNOSIS — O10913 Unspecified pre-existing hypertension complicating pregnancy, third trimester: Secondary | ICD-10-CM

## 2022-01-28 DIAGNOSIS — O0993 Supervision of high risk pregnancy, unspecified, third trimester: Secondary | ICD-10-CM

## 2022-01-28 NOTE — Progress Notes (Signed)
Pt informed that the ultrasound is considered a limited OB ultrasound and is not intended to be a complete ultrasound exam.  Patient also informed that the ultrasound is not being completed with the intent of assessing for fetal or placental anomalies or any pelvic abnormalities.  Explained that the purpose of today's ultrasound is to assess for  BPP, presentation, and AFI.  Patient acknowledges the purpose of the exam and the limitations of the study.     [name] RN BSN 01/28/22

## 2022-01-28 NOTE — Progress Notes (Signed)
   Subjective:  Yesenia Velasquez is a 42 y.o. G2P1001 at 39w1dbeing seen today for ongoing prenatal care.  She is currently monitored for the following issues for this high-risk pregnancy and has Essential hypertension; Supervision of high risk pregnancy, antepartum; AMA (advanced maternal age) multigravida 446+ third trimester; Chronic hypertension affecting pregnancy; History of cesarean delivery affecting pregnancy; Legally blind; Congenital glaucoma; and Rhegmatogenous retinal detachment on their problem list.  Patient reports no complaints.  Contractions: Not present.  .  Movement: Present. Denies leaking of fluid.   The following portions of the patient's history were reviewed and updated as appropriate: allergies, current medications, past family history, past medical history, past social history, past surgical history and problem list. Problem list updated.  Objective:  BP 127/85  Fetal Status:     Movement: Present     General:  Alert, oriented and cooperative. Patient is in no acute distress.  Skin: Skin is warm and dry. No rash noted.   Cardiovascular: Normal heart rate noted  Respiratory: Normal respiratory effort, no problems with respiration noted  Abdomen: Soft, gravid, appropriate for gestational age.       Pelvic:       Cervical exam deferred        Extremities: Normal range of motion.     Mental Status: Normal mood and affect. Normal behavior. Normal judgment and thought content.   Urinalysis:      Assessment and Plan:  Pregnancy: G2P1001 at 356w1d1. Supervision of high risk pregnancy, antepartum BP and FHR normal (see NST)  2. Chronic hypertension affecting pregnancy Normotensive on Nifed 30 daily On ASA Antenatal testing reassuring to date Normal growth scan on 01/20/22  3. AMA (advanced maternal age) multigravida 4090+third trimester Engaged in antenatal testing On ASA  4. History of cesarean delivery affecting pregnancy Scheduled for RCS on  02/03/2022  5. Legally blind   Term labor symptoms and general obstetric precautions including but not limited to vaginal bleeding, contractions, leaking of fluid and fetal movement were reviewed in detail with the patient. Please refer to After Visit Summary for other counseling recommendations.  [facility] (on 02[facility]04[facility]2022[facility]   EcClarnce FlockMD

## 2022-01-28 NOTE — Patient Instructions (Signed)

## 2022-02-01 ENCOUNTER — Encounter (HOSPITAL_COMMUNITY)
Admission: RE | Admit: 2022-02-01 | Discharge: 2022-02-01 | Disposition: A | Discharge status: Home / Self Care | Payer: BC Managed Care – PPO | Source: Ambulatory Visit | Attending: Obstetrics and Gynecology | Admitting: Obstetrics and Gynecology

## 2022-02-01 DIAGNOSIS — O0990 Supervision of high risk pregnancy, unspecified, unspecified trimester: Secondary | ICD-10-CM

## 2022-02-01 DIAGNOSIS — O10913 Unspecified pre-existing hypertension complicating pregnancy, third trimester: Secondary | ICD-10-CM | POA: Insufficient documentation

## 2022-02-01 DIAGNOSIS — Z98891 History of uterine scar from previous surgery: Secondary | ICD-10-CM | POA: Insufficient documentation

## 2022-02-01 DIAGNOSIS — O09523 Supervision of elderly multigravida, third trimester: Secondary | ICD-10-CM | POA: Insufficient documentation

## 2022-02-01 DIAGNOSIS — Z3A38 38 weeks gestation of pregnancy: Secondary | ICD-10-CM | POA: Insufficient documentation

## 2022-02-01 LAB — CBC
HCT: 38.2 % (ref 36.0–46.0)
Hemoglobin: 13.2 g/dL (ref 12.0–15.0)
MCH: 31.4 pg (ref 26.0–34.0)
MCHC: 34.6 g/dL (ref 30.0–36.0)
MCV: 91 fL (ref 80.0–100.0)
Platelets: 230 10*3/uL (ref 150–400)
RBC: 4.2 MIL/uL (ref 3.87–5.11)
RDW: 14.3 % (ref 11.5–15.5)
WBC: 9.4 10*3/uL (ref 4.0–10.5)
nRBC: 0 % (ref 0.0–0.2)

## 2022-02-01 LAB — TYPE AND SCREEN
ABO/RH(D): O POS
Antibody Screen: NEGATIVE

## 2022-02-01 LAB — RPR: RPR Ser Ql: NONREACTIVE

## 2022-02-02 ENCOUNTER — Encounter (HOSPITAL_COMMUNITY): Payer: Self-pay | Admitting: Obstetrics and Gynecology

## 2022-02-03 ENCOUNTER — Inpatient Hospital Stay (HOSPITAL_COMMUNITY)
Admission: RE | Admit: 2022-02-03 | Discharge: 2022-02-05 | DRG: 784 | Disposition: A | Discharge status: Home / Self Care | Payer: BC Managed Care – PPO | Source: Ambulatory Visit | Attending: Obstetrics and Gynecology | Admitting: Obstetrics and Gynecology

## 2022-02-03 ENCOUNTER — Encounter (HOSPITAL_COMMUNITY)
Admission: RE | Disposition: A | Discharge status: Home / Self Care | Payer: Self-pay | Source: Ambulatory Visit | Attending: Obstetrics and Gynecology

## 2022-02-03 ENCOUNTER — Other Ambulatory Visit: Payer: Self-pay

## 2022-02-03 ENCOUNTER — Inpatient Hospital Stay (HOSPITAL_COMMUNITY): Payer: BC Managed Care – PPO | Admitting: Anesthesiology

## 2022-02-03 ENCOUNTER — Encounter (HOSPITAL_COMMUNITY): Payer: Self-pay | Admitting: Obstetrics and Gynecology

## 2022-02-03 DIAGNOSIS — O1002 Pre-existing essential hypertension complicating childbirth: Secondary | ICD-10-CM | POA: Diagnosis present

## 2022-02-03 DIAGNOSIS — Z98891 History of uterine scar from previous surgery: Secondary | ICD-10-CM

## 2022-02-03 DIAGNOSIS — O34211 Maternal care for low transverse scar from previous cesarean delivery: Principal | ICD-10-CM | POA: Diagnosis present

## 2022-02-03 DIAGNOSIS — O09523 Supervision of elderly multigravida, third trimester: Secondary | ICD-10-CM | POA: Diagnosis present

## 2022-02-03 DIAGNOSIS — Z9079 Acquired absence of other genital organ(s): Secondary | ICD-10-CM

## 2022-02-03 DIAGNOSIS — H548 Legal blindness, as defined in USA: Secondary | ICD-10-CM | POA: Diagnosis present

## 2022-02-03 DIAGNOSIS — O99344 Other mental disorders complicating childbirth: Secondary | ICD-10-CM | POA: Diagnosis present

## 2022-02-03 DIAGNOSIS — Z302 Encounter for sterilization: Secondary | ICD-10-CM

## 2022-02-03 DIAGNOSIS — O9902 Anemia complicating childbirth: Secondary | ICD-10-CM | POA: Diagnosis present

## 2022-02-03 DIAGNOSIS — O0990 Supervision of high risk pregnancy, unspecified, unspecified trimester: Secondary | ICD-10-CM

## 2022-02-03 DIAGNOSIS — Z7982 Long term (current) use of aspirin: Secondary | ICD-10-CM

## 2022-02-03 DIAGNOSIS — F419 Anxiety disorder, unspecified: Secondary | ICD-10-CM | POA: Diagnosis present

## 2022-02-03 DIAGNOSIS — I10 Essential (primary) hypertension: Principal | ICD-10-CM

## 2022-02-03 DIAGNOSIS — Z3A38 38 weeks gestation of pregnancy: Secondary | ICD-10-CM

## 2022-02-03 DIAGNOSIS — F32A Depression, unspecified: Secondary | ICD-10-CM

## 2022-02-03 DIAGNOSIS — O10919 Unspecified pre-existing hypertension complicating pregnancy, unspecified trimester: Secondary | ICD-10-CM | POA: Diagnosis present

## 2022-02-03 SURGERY — Surgical Case
Anesthesia: Spinal

## 2022-02-03 MED ORDER — NALOXONE HCL 4 MG/10ML IJ SOLN: 1.0000 ug/kg/h | INTRAVENOUS | Status: DC | PRN

## 2022-02-03 MED ORDER — MEASLES, MUMPS & RUBELLA VAC IJ SOLR: 0.5000 mL | Freq: Once | INTRAMUSCULAR | Status: DC

## 2022-02-03 MED ORDER — ONDANSETRON HCL 4 MG/2ML IJ SOLN: 4.0000 mg | Freq: Three times a day (TID) | INTRAMUSCULAR | Status: DC | PRN

## 2022-02-03 MED ORDER — BUPIVACAINE IN DEXTROSE 0.75-8.25 % IT SOLN
INTRATHECAL | Status: DC | PRN
  Administered 2022-02-03: 1.6 mL via INTRATHECAL

## 2022-02-03 MED ORDER — PHENYLEPHRINE HCL-NACL 20-0.9 MG/250ML-% IV SOLN
INTRAVENOUS | Status: AC
  Filled 2022-02-03: qty 250

## 2022-02-03 MED ORDER — ONDANSETRON HCL 4 MG/2ML IJ SOLN
INTRAMUSCULAR | Status: AC
  Filled 2022-02-03: qty 2

## 2022-02-03 MED ORDER — AMLODIPINE BESYLATE 5 MG PO TABS
10.0000 mg | ORAL_TABLET | Freq: Every day | ORAL | Status: DC
  Administered 2022-02-03 – 2022-02-05 (×2): 10 mg via ORAL
  Filled 2022-02-03 (×4): qty 2

## 2022-02-03 MED ORDER — ORAL CARE MOUTH RINSE: 15.0000 mL | Freq: Once | OROMUCOSAL | Status: DC

## 2022-02-03 MED ORDER — SOD CITRATE-CITRIC ACID 500-334 MG/5ML PO SOLN
ORAL | Status: AC
  Filled 2022-02-03: qty 30

## 2022-02-03 MED ORDER — FAMOTIDINE 20 MG PO TABS
20.0000 mg | ORAL_TABLET | Freq: Once | ORAL | Status: AC
  Administered 2022-02-03: 20 mg via ORAL

## 2022-02-03 MED ORDER — DIPHENHYDRAMINE HCL 50 MG/ML IJ SOLN
12.5000 mg | INTRAMUSCULAR | Status: DC | PRN
  Administered 2022-02-03: 12.5 mg via INTRAVENOUS
  Filled 2022-02-03: qty 1

## 2022-02-03 MED ORDER — DEXAMETHASONE SODIUM PHOSPHATE 10 MG/ML IJ SOLN
INTRAMUSCULAR | Status: AC
  Filled 2022-02-03: qty 1

## 2022-02-03 MED ORDER — ONDANSETRON HCL 4 MG/2ML IJ SOLN
INTRAMUSCULAR | Status: DC | PRN
  Administered 2022-02-03: 4 mg via INTRAVENOUS

## 2022-02-03 MED ORDER — ACETAMINOPHEN 10 MG/ML IV SOLN
INTRAVENOUS | Status: DC | PRN
  Administered 2022-02-03: 1000 mg via INTRAVENOUS

## 2022-02-03 MED ORDER — SIMETHICONE 80 MG PO CHEW: 80.0000 mg | CHEWABLE_TABLET | ORAL | Status: DC | PRN

## 2022-02-03 MED ORDER — MORPHINE SULFATE (PF) 0.5 MG/ML IJ SOLN
INTRAMUSCULAR | Status: AC
  Filled 2022-02-03: qty 10

## 2022-02-03 MED ORDER — TRANEXAMIC ACID-NACL 1000-0.7 MG/100ML-% IV SOLN
INTRAVENOUS | Status: AC
  Filled 2022-02-03: qty 100

## 2022-02-03 MED ORDER — FAMOTIDINE 20 MG PO TABS
ORAL_TABLET | ORAL | Status: AC
  Filled 2022-02-03: qty 1

## 2022-02-03 MED ORDER — OXYCODONE HCL 5 MG PO TABS: 5.0000 mg | ORAL_TABLET | ORAL | Status: DC | PRN

## 2022-02-03 MED ORDER — LACTATED RINGERS IV SOLN: INTRAVENOUS | Status: DC

## 2022-02-03 MED ORDER — SIMETHICONE 80 MG PO CHEW
80.0000 mg | CHEWABLE_TABLET | Freq: Three times a day (TID) | ORAL | Status: DC
  Administered 2022-02-04 – 2022-02-05 (×4): 80 mg via ORAL
  Filled 2022-02-03 (×4): qty 1

## 2022-02-03 MED ORDER — OXYTOCIN-SODIUM CHLORIDE 30-0.9 UT/500ML-% IV SOLN
INTRAVENOUS | Status: AC
  Filled 2022-02-03: qty 500

## 2022-02-03 MED ORDER — OXYCODONE HCL 5 MG PO TABS: 5.0000 mg | ORAL_TABLET | Freq: Once | ORAL | Status: DC | PRN

## 2022-02-03 MED ORDER — MORPHINE SULFATE (PF) 0.5 MG/ML IJ SOLN
INTRAMUSCULAR | Status: DC | PRN
  Administered 2022-02-03: 150 ug via INTRATHECAL

## 2022-02-03 MED ORDER — ACETAMINOPHEN 500 MG PO TABS
1000.0000 mg | ORAL_TABLET | Freq: Once | ORAL | Status: AC
  Administered 2022-02-03: 1000 mg via ORAL

## 2022-02-03 MED ORDER — DIBUCAINE (PERIANAL) 1 % EX OINT: 1.0000 | TOPICAL_OINTMENT | CUTANEOUS | Status: DC | PRN

## 2022-02-03 MED ORDER — WITCH HAZEL-GLYCERIN EX PADS: 1.0000 | MEDICATED_PAD | CUTANEOUS | Status: DC | PRN

## 2022-02-03 MED ORDER — OXYCODONE HCL 5 MG/5ML PO SOLN: 5.0000 mg | Freq: Once | ORAL | Status: DC | PRN

## 2022-02-03 MED ORDER — ACETAMINOPHEN 500 MG PO TABS
1000.0000 mg | ORAL_TABLET | Freq: Four times a day (QID) | ORAL | Status: DC
  Administered 2022-02-03 – 2022-02-05 (×6): 1000 mg via ORAL
  Filled 2022-02-03 (×7): qty 2

## 2022-02-03 MED ORDER — ACETAMINOPHEN 500 MG PO TABS
ORAL_TABLET | ORAL | Status: AC
  Filled 2022-02-03: qty 1

## 2022-02-03 MED ORDER — FENTANYL CITRATE (PF) 100 MCG/2ML IJ SOLN
INTRAMUSCULAR | Status: DC | PRN
  Administered 2022-02-03: 15 ug via INTRATHECAL

## 2022-02-03 MED ORDER — PHENYLEPHRINE HCL-NACL 20-0.9 MG/250ML-% IV SOLN
INTRAVENOUS | Status: DC | PRN
  Administered 2022-02-03: 60 ug/min via INTRAVENOUS

## 2022-02-03 MED ORDER — CEFAZOLIN SODIUM-DEXTROSE 2-4 GM/100ML-% IV SOLN
INTRAVENOUS | Status: AC
  Filled 2022-02-03: qty 100

## 2022-02-03 MED ORDER — SENNOSIDES-DOCUSATE SODIUM 8.6-50 MG PO TABS
2.0000 | ORAL_TABLET | ORAL | Status: DC
  Administered 2022-02-04: 2 via ORAL
  Filled 2022-02-03: qty 2

## 2022-02-03 MED ORDER — SCOPOLAMINE 1 MG/3DAYS TD PT72: 1.0000 | MEDICATED_PATCH | Freq: Once | TRANSDERMAL | Status: DC

## 2022-02-03 MED ORDER — NALOXONE HCL 0.4 MG/ML IJ SOLN: 0.4000 mg | INTRAMUSCULAR | Status: DC | PRN

## 2022-02-03 MED ORDER — DEXMEDETOMIDINE HCL IN NACL 80 MCG/20ML IV SOLN
INTRAVENOUS | Status: DC | PRN
  Administered 2022-02-03: 8 ug via BUCCAL
  Administered 2022-02-03: 4 ug via BUCCAL
  Administered 2022-02-03: 8 ug via BUCCAL

## 2022-02-03 MED ORDER — KETOROLAC TROMETHAMINE 30 MG/ML IJ SOLN
30.0000 mg | Freq: Four times a day (QID) | INTRAMUSCULAR | Status: AC | PRN
  Administered 2022-02-03 – 2022-02-04 (×2): 30 mg via INTRAVENOUS
  Filled 2022-02-03 (×2): qty 1

## 2022-02-03 MED ORDER — SCOPOLAMINE 1 MG/3DAYS TD PT72
1.0000 | MEDICATED_PATCH | Freq: Once | TRANSDERMAL | Status: DC
  Administered 2022-02-03: 1.5 mg via TRANSDERMAL

## 2022-02-03 MED ORDER — COCONUT OIL OIL: 1.0000 | TOPICAL_OIL | Status: DC | PRN

## 2022-02-03 MED ORDER — SCOPOLAMINE 1 MG/3DAYS TD PT72
MEDICATED_PATCH | TRANSDERMAL | Status: AC
  Filled 2022-02-03: qty 1

## 2022-02-03 MED ORDER — FENTANYL CITRATE (PF) 100 MCG/2ML IJ SOLN: 25.0000 ug | INTRAMUSCULAR | Status: DC | PRN

## 2022-02-03 MED ORDER — POVIDONE-IODINE 10 % EX SWAB
2.0000 | Freq: Once | CUTANEOUS | Status: AC
  Administered 2022-02-03: 2 via TOPICAL

## 2022-02-03 MED ORDER — CEFAZOLIN SODIUM-DEXTROSE 2-4 GM/100ML-% IV SOLN
2.0000 g | INTRAVENOUS | Status: AC
  Administered 2022-02-03: 2 g via INTRAVENOUS

## 2022-02-03 MED ORDER — DEXAMETHASONE SODIUM PHOSPHATE 10 MG/ML IJ SOLN
INTRAMUSCULAR | Status: DC | PRN
  Administered 2022-02-03: 10 mg via INTRAVENOUS

## 2022-02-03 MED ORDER — OXYTOCIN-SODIUM CHLORIDE 30-0.9 UT/500ML-% IV SOLN: 2.5000 [IU]/h | INTRAVENOUS | Status: AC

## 2022-02-03 MED ORDER — PROMETHAZINE HCL 25 MG/ML IJ SOLN: 6.2500 mg | INTRAMUSCULAR | Status: DC | PRN

## 2022-02-03 MED ORDER — DIPHENHYDRAMINE HCL 25 MG PO CAPS: 25.0000 mg | ORAL_CAPSULE | Freq: Four times a day (QID) | ORAL | Status: DC | PRN

## 2022-02-03 MED ORDER — ZOLPIDEM TARTRATE 5 MG PO TABS: 5.0000 mg | ORAL_TABLET | Freq: Every evening | ORAL | Status: DC | PRN

## 2022-02-03 MED ORDER — KETOROLAC TROMETHAMINE 30 MG/ML IJ SOLN: 30.0000 mg | Freq: Four times a day (QID) | INTRAMUSCULAR | Status: AC | PRN

## 2022-02-03 MED ORDER — IBUPROFEN 600 MG PO TABS
600.0000 mg | ORAL_TABLET | Freq: Four times a day (QID) | ORAL | Status: DC
  Administered 2022-02-04 – 2022-02-05 (×3): 600 mg via ORAL
  Filled 2022-02-03 (×3): qty 1

## 2022-02-03 MED ORDER — LORAZEPAM 2 MG/ML IJ SOLN
1.0000 mg | Freq: Once | INTRAMUSCULAR | Status: AC | PRN
  Administered 2022-02-03: 1 mg via INTRAVENOUS
  Filled 2022-02-03: qty 1

## 2022-02-03 MED ORDER — SOD CITRATE-CITRIC ACID 500-334 MG/5ML PO SOLN
30.0000 mL | Freq: Once | ORAL | Status: AC
  Administered 2022-02-03: 30 mL via ORAL

## 2022-02-03 MED ORDER — DIPHENHYDRAMINE HCL 25 MG PO CAPS: 25.0000 mg | ORAL_CAPSULE | ORAL | Status: DC | PRN

## 2022-02-03 MED ORDER — ACETAMINOPHEN 10 MG/ML IV SOLN
INTRAVENOUS | Status: AC
  Filled 2022-02-03: qty 100

## 2022-02-03 MED ORDER — OXYTOCIN-SODIUM CHLORIDE 30-0.9 UT/500ML-% IV SOLN
INTRAVENOUS | Status: DC | PRN
  Administered 2022-02-03: 30 [IU] via INTRAVENOUS

## 2022-02-03 MED ORDER — SODIUM CHLORIDE 0.9% FLUSH: 3.0000 mL | INTRAVENOUS | Status: DC | PRN

## 2022-02-03 MED ORDER — TRANEXAMIC ACID-NACL 1000-0.7 MG/100ML-% IV SOLN
INTRAVENOUS | Status: DC | PRN
  Administered 2022-02-03: 1000 mg via INTRAVENOUS

## 2022-02-03 MED ORDER — ACETAMINOPHEN 160 MG/5ML PO SOLN: 960.0000 mg | Freq: Once | ORAL | Status: AC

## 2022-02-03 MED ORDER — CHLORHEXIDINE GLUCONATE 0.12 % MT SOLN: 15.0000 mL | Freq: Once | OROMUCOSAL | Status: DC

## 2022-02-03 MED ORDER — PRENATAL MULTIVITAMIN CH
1.0000 | ORAL_TABLET | Freq: Every day | ORAL | Status: DC
  Administered 2022-02-04: 1 via ORAL
  Filled 2022-02-03: qty 1

## 2022-02-03 MED ORDER — FENTANYL CITRATE (PF) 100 MCG/2ML IJ SOLN
INTRAMUSCULAR | Status: AC
  Filled 2022-02-03: qty 2

## 2022-02-03 MED ORDER — MEPERIDINE HCL 25 MG/ML IJ SOLN: 6.2500 mg | INTRAMUSCULAR | Status: DC | PRN

## 2022-02-03 MED ORDER — MENTHOL 3 MG MT LOZG: 1.0000 | LOZENGE | OROMUCOSAL | Status: DC | PRN

## 2022-02-03 MED ORDER — ENOXAPARIN SODIUM 40 MG/0.4ML IJ SOSY
40.0000 mg | PREFILLED_SYRINGE | INTRAMUSCULAR | Status: DC
  Administered 2022-02-04 – 2022-02-05 (×2): 40 mg via SUBCUTANEOUS
  Filled 2022-02-03 (×2): qty 0.4

## 2022-02-03 SURGICAL SUPPLY — 35 items
BENZOIN TINCTURE PRP APPL 2/3 (GAUZE/BANDAGES/DRESSINGS) IMPLANT
CATH ROBINSON RED A/P 16FR (CATHETERS) IMPLANT
CHLORAPREP W/TINT 26ML (MISCELLANEOUS) ×2 IMPLANT
CLAMP UMBILICAL CORD (MISCELLANEOUS) ×1 IMPLANT
CLOTH BEACON ORANGE TIMEOUT ST (SAFETY) ×1 IMPLANT
DERMABOND ADVANCED .7 DNX12 (GAUZE/BANDAGES/DRESSINGS) ×2 IMPLANT
DRSG OPSITE POSTOP 4X10 (GAUZE/BANDAGES/DRESSINGS) ×1 IMPLANT
ELECT REM PT RETURN 9FT ADLT (ELECTROSURGICAL) ×1
ELECTRODE REM PT RTRN 9FT ADLT (ELECTROSURGICAL) ×1 IMPLANT
EXTRACTOR VACUUM M CUP 4 TUBE (SUCTIONS) IMPLANT
GAUZE SPONGE 4X4 12PLY STRL LF (GAUZE/BANDAGES/DRESSINGS) IMPLANT
GLOVE BIOGEL PI IND STRL 7.0 (GLOVE) ×1 IMPLANT
GLOVE ECLIPSE 7.5 STRL STRAW (GLOVE) ×1 IMPLANT
GOWN STRL REUS W/TWL LRG LVL3 (GOWN DISPOSABLE) ×3 IMPLANT
HEMOSTAT ARISTA ABSORB 3G PWDR (HEMOSTASIS) IMPLANT
KIT ABG SYR 3ML LUER SLIP (SYRINGE) IMPLANT
NDL HYPO 25X5/8 SAFETYGLIDE (NEEDLE) IMPLANT
NEEDLE HYPO 25X5/8 SAFETYGLIDE (NEEDLE) IMPLANT
NS IRRIG 1000ML POUR BTL (IV SOLUTION) ×1 IMPLANT
PACK C SECTION WH (CUSTOM PROCEDURE TRAY) ×1 IMPLANT
PAD ABD 7.5X8 STRL (GAUZE/BANDAGES/DRESSINGS) IMPLANT
PAD OB MATERNITY 4.3X12.25 (PERSONAL CARE ITEMS) ×1 IMPLANT
PENCIL SMOKE EVAC W/HOLSTER (ELECTROSURGICAL) ×1 IMPLANT
RTRCTR C-SECT PINK 25CM LRG (MISCELLANEOUS) ×1 IMPLANT
STRIP CLOSURE SKIN 1/2X4 (GAUZE/BANDAGES/DRESSINGS) IMPLANT
SUT PLAIN 2 0 (SUTURE) ×1
SUT PLAIN ABS 2-0 54XMFL TIE (SUTURE) ×1 IMPLANT
SUT VIC AB 0 CTX 36 (SUTURE) ×3
SUT VIC AB 0 CTX36XBRD ANBCTRL (SUTURE) ×3 IMPLANT
SUT VIC AB 2-0 CT1 27 (SUTURE) ×1
SUT VIC AB 2-0 CT1 TAPERPNT 27 (SUTURE) ×1 IMPLANT
SUT VIC AB 4-0 KS 27 (SUTURE) ×1 IMPLANT
TOWEL OR 17X24 6PK STRL BLUE (TOWEL DISPOSABLE) ×1 IMPLANT
TRAY FOLEY W/BAG SLVR 14FR LF (SET/KITS/TRAYS/PACK) ×1 IMPLANT
WATER STERILE IRR 1000ML POUR (IV SOLUTION) ×1 IMPLANT

## 2022-02-03 NOTE — Anesthesia Preprocedure Evaluation (Addendum)
Anesthesia Evaluation  Patient identified by MRN, date of birth, ID band Patient awake    Reviewed: Allergy & Precautions, NPO status , Patient's Chart, lab work & pertinent test results  History of Anesthesia Complications Negative for: history of anesthetic complications  Airway Mallampati: II  TM Distance: >3 FB Neck ROM: Full    Dental  (+) Dental Advisory Given, Teeth Intact   Pulmonary neg pulmonary ROS   Pulmonary exam normal        Cardiovascular hypertension, Pt. on medications  Rhythm:Regular Rate:Tachycardia     Neuro/Psych  PSYCHIATRIC DISORDERS Anxiety      Blindness     GI/Hepatic negative GI ROS, Neg liver ROS,,,  Endo/Other  negative endocrine ROS    Renal/GU negative Renal ROS     Musculoskeletal negative musculoskeletal ROS (+)    Abdominal   Peds  Hematology negative hematology ROS (+)   Anesthesia Other Findings   Reproductive/Obstetrics (+) Pregnancy                             Anesthesia Physical Anesthesia Plan  ASA: 2  Anesthesia Plan: Spinal   Post-op Pain Management: Tylenol PO (pre-op)* and Regional block*   Induction:   PONV Risk Score and Plan: 2 and Treatment may vary due to age or medical condition, Ondansetron and Scopolamine patch - Pre-op  Airway Management Planned: Natural Airway  Additional Equipment: None  Intra-op Plan:   Post-operative Plan:   Informed Consent: I have reviewed the patients History and Physical, chart, labs and discussed the procedure including the risks, benefits and alternatives for the proposed anesthesia with the patient or authorized representative who has indicated his/her understanding and acceptance.       Plan Discussed with: CRNA and Anesthesiologist  Anesthesia Plan Comments: (Labs reviewed, platelets acceptable. Discussed risks and benefits of spinal, including spinal/epidural hematoma, infection,  failed block, and PDPH. Patient expressed understanding and wished to proceed. )       Anesthesia Quick Evaluation

## 2022-02-03 NOTE — Op Note (Signed)
Cesarean Section Operative Report  Yesenia Velasquez  02/03/2022  Indications: history prior cesarean, maternal request  Pre-operative Diagnosis: repeat low transverse cesarean section, bilateral salpingectomy  Post-operative Diagnosis: Same   Surgeon: Surgeon(s) and Role:    * Henna Derderian, Ailene Rud, MD - Primary    * Mercado-Ortiz, Ernestine Conrad, DO - Assisting     * Bradly Bienenstock, PA student observing  Attending Attestation: I was present and scrubbed for the entire procedure.   An experienced assistant was required given the standard of surgical care given the complexity of the case.  This assistant was needed for exposure, dissection, suctioning, retraction, instrument exchange, assisting with delivery with administration of fundal pressure, and for overall help during the procedure.  Anesthesia: spinal    Quantified Blood Loss: 663 ml  Total IV Fluids: 1000 ml LR  Urine Output:: 100 ml clear yellow urine  Specimens: fallopian tubes to pathology  Findings: Viable female infant in cephalic presentation; Apgars pending; weight pending; arterial cord pH not obtained;  clear amniotic fluid; intact placenta with three vessel cord; normal uterus, fallopian tubes and ovaries bilaterally.  Baby condition / location:  Couplet care / Skin to Skin   Complications: no complications  Indications: Yesenia Velasquez is a 42 y.o. B2W4132 with an IUP 88w0dpresenting for scheduled repeat cesarean section.  The risks, benefits, complications, treatment options, and exected outcomes were discussed with the patient . The patient dwith the proposed plan, giving informed consent. identified as Yesenia Fleetingand the procedure verified as C-Section Delivery.  Procedure Details:  The patient was taken back to the operative suite where spinal anesthesia was placed.  A time out was held and the above information confirmed.   After induction of anesthesia, the patient was draped and prepped in the  usual sterile manner and placed in a dorsal supine position with a leftward tilt. A Pfannenstiel incision was made and carried down through the subcutaneous tissue to the fascia. Fascial incision was made and sharply extended transversely. The fascia was separated from the underlying rectus tissue superiorly and inferiorly. The peritoneum was identified and sharply entered and extended longitudinally. Alexis retractor was placed. A bladder flap was created. A low transverse uterine incision was made and extended bluntly. Delivered from cephalic presentation was a viable infant with Apgars and weight as above.  After waiting 60 seconds for delayed cord cutting, the umbilical cord was clamped and cut cord blood was obtained for evaluation. Cord ph was not sent. The placenta was removed Intact and appeared normal. The uterine outline, tubes and ovaries appeared normal. The uterine incision was closed with running unlocked sutures 0-Vicryl in one layer.   Hemostasis was observed.   Attention was then turned to the left fallopian tube. Kelly forceps were placed on the mesosalpinx underneath most of the tube.  This pedicle was double suture ligated with 2-0 plain gut, and the tube including the fimbriated end was excised.  The right fallopian tube was then identified, doubly ligated, and was excised in a similar fashion allowing for bilateral tubal sterilization via bilateral salpingectomy.  Good hemostasis was noted overall.  The peritoneum was closed with 2-0 vicryl. The rectus muscles were examined and hemostasis observed after careful bovie cautery of numerous sites and application of Arista. The fascia was then reapproximated with running sutures of 0-Vicryl. The subcuticular closure was not performed. The skin was closed with 4-0 Vicryl.  Instrument, sponge, and needle counts were correct prior the abdominal closure and were  correct at the conclusion of the case.     Disposition: PACU - hemodynamically  stable.   Maternal Condition: stable       Signed: Ennis Forts 02/03/2022 4:34 PM

## 2022-02-03 NOTE — Discharge Summary (Signed)
Postpartum Discharge Summary       Patient Name: Yesenia Velasquez DOB: [birth year] MRN: [id number]  Date of admission: 02/03/2022 Delivery date:02/03/2022  Delivering provider: Laurey Arrow [place]  Date of discharge: 02/05/2022  Admitting diagnosis: RCS Undesired Fertility Intrauterine pregnancy: [redacted]w[redacted]d    Secondary diagnosis:  Active Problems:   Supervision of high risk pregnancy, antepartum   AMA (advanced maternal age) multigravida 460+ third trimester   Chronic hypertension affecting pregnancy   Status post repeat low transverse cesarean section   Legally blind   Status post bilateral salpingectomy  Additional problems: Anxiety     Discharge diagnosis: Term Pregnancy Delivered and CHTN                                              Post partum procedures: None Augmentation: N/A Complications: None  Hospital course: Sceduled C/S   42y.o. yo G2P2002 at 340w0das admitted to the hospital 02/03/2022 for scheduled cesarean section with the following indication:Elective Repeat.Delivery details are as follows:  Membrane Rupture Time/Date: 2:18 PM ,02/03/2022   Delivery Method:C-Section, Low Transverse  Details of operation can be found in separate operative note.  Patient had a postpartum course complicated by anxiety and was seen by SW.  She was also give ativan with good results. Patient was started on Zoloft prior to discharge. She is ambulating, tolerating a regular diet, passing flatus, and urinating well. Patient is discharged home in stable condition on  02/05/22        Newborn Data: Birth date:02/03/2022  Birth time:2:19 PM  Gender:Female  Living status:Living  Apgars:8 ,9  Weight:3060 g     Magnesium Sulfate received: No BMZ received: No Rhophylac:N/A MMR:N/A T-DaP:Given prenatally Flu: Yes Transfusion:No  Physical exam  Vitals:   02/03/22 1047  BP: (!) 130/92  Pulse: (!) 141  Resp: 20  Temp: 98.1 F (36.7 C)  TempSrc: Oral  Weight: 74.8 kg  Height: '5\' 3"'$   (1.6 m)   General: alert and cooperative Lochia: appropriate Uterine Fundus: firm Incision: Dressing is clean, dry, and intact DVT Evaluation: No evidence of DVT seen on physical exam. No significant calf/ankle edema. Labs: Lab Results  Component Value Date   WBC 9.4 02/01/2022   HGB 13.2 02/01/2022   HCT 38.2 02/01/2022   MCV 91.0 02/01/2022   PLT 230 02/01/2022      Latest Ref Rng & Units 10/21/2021    3:33 PM  CMP  Glucose 70 - 99 mg/dL 96   BUN 6 - 24 mg/dL 7   Creatinine 0.57 - 1.00 mg/dL 0.76   Sodium 134 - 144 mmol/L 138   Potassium 3.5 - 5.2 mmol/L 3.9   Chloride 96 - 106 mmol/L 102   CO2 20 - 29 mmol/L 20   Calcium 8.7 - 10.2 mg/dL 9.5   Total Protein 6.0 - 8.5 g/dL 6.7   Total Bilirubin 0.0 - 1.2 mg/dL 0.3   Alkaline Phos 44 - 121 IU/L 66   AST 0 - 40 IU/L 18   ALT 0 - 32 IU/L 18    Edinburgh Score:     No data to display           After visit meds:  Allergies as of 02/05/2022   No Known Allergies      Medication List     STOP taking these medications  aspirin EC 81 MG tablet       TAKE these medications    acetaminophen 500 MG tablet Commonly known as: TYLENOL Take 2 tablets (1,000 mg total) by mouth every 6 (six) hours.   calcium carbonate 500 MG chewable tablet Commonly known as: Tums Chew 1 tablet (200 mg of elemental calcium total) by mouth as needed for indigestion or heartburn. Follow directions on bottle   doxylamine (Sleep) 25 MG tablet Commonly known as: UNISOM Take 25 mg by mouth at bedtime as needed for sleep.   ferrous sulfate 325 (65 FE) MG tablet Take 1 tablet (325 mg total) by mouth every other day. Start taking on: February 06, 2022   hydrOXYzine 50 MG tablet Commonly known as: ATARAX Take 1 tablet (50 mg total) by mouth 3 (three) times daily as needed for anxiety.   ibuprofen 600 MG tablet Commonly known as: ADVIL Take 1 tablet (600 mg total) by mouth every 6 (six) hours.   NIFEdipine 30 MG 24 hr  tablet Commonly known as: PROCARDIA-XL/NIFEDICAL-XL Take 1 tablet (30 mg total) by mouth daily.   Prenatal 27-1 MG Tabs Take 1 tablet by mouth daily.   senna-docusate 8.6-50 MG tablet Commonly known as: Senokot-S Take 2 tablets by mouth daily.   sertraline 50 MG tablet Commonly known as: Zoloft Take 1 tablet (50 mg total) by mouth daily.         Discharge home in stable condition Infant Feeding: Bottle and Breast Infant Disposition:home with mother Discharge instruction: per After Visit Summary and Postpartum booklet. Activity: Advance as tolerated. Pelvic rest for 6 weeks.  Diet: routine diet Future Appointments: Future Appointments  Date Time Provider [place]  02/10/2022  1:30 PM [place] [facility]  02/24/2022  1:15 PM [facility]  03/17/2022  2:35 PM Aletha Halim, MD [facility]   Follow up Visit: Message sent to [facility] 1/10  Please schedule this patient for a In person postpartum visit in 6 weeks with the following provider: Any provider. Additional Postpartum F/U:Incision check 1 week and BP check 1 week  High risk pregnancy complicated by: HTN Delivery mode:  C-Section, Low Transverse  Anticipated Birth Control:  BTL done PP   02/03/2022 Shelda Pal, DO   Maryann Conners MSN, CNM [facility] Provider[facility] [place] 02/05/2022 11:46 AM

## 2022-02-03 NOTE — Progress Notes (Signed)
Pt last two blood pressure 138/82 and 141/93, heart rate 106. Pt denies any signs or symptoms of Preeclampsia. Pt has Norvasc 10 mg po due now. Pt concerned with taking Norvasc now because pt took Procardia 30 mg this morning before coming to the hospital. Dr. Clement Husbands notified. No new orders and verbalized to give pt Norvasc 10 mg po now. Pt advised and given Norvasc 10 mg po. Will continue to monitor pt.

## 2022-02-03 NOTE — Transfer of Care (Signed)
Immediate Anesthesia Transfer of Care Note  Patient: Yesenia Velasquez  Procedure(s) Performed: CESAREAN SECTION WITH BILATERAL TUBAL LIGATION  Patient Location: PACU  Anesthesia Type:Spinal  Level of Consciousness: awake, alert , oriented, and patient cooperative  Airway & Oxygen Therapy: Patient Spontanous Breathing  Post-op Assessment: Report given to RN and Post -op Vital signs reviewed and stable  Post vital signs: Reviewed and stable  Last Vitals:  Vitals Value Taken Time  BP 113/65 02/03/22 1515  Temp    Pulse 93 02/03/22 1518  Resp 26 02/03/22 1518  SpO2 99 % 02/03/22 1518  Vitals shown include unvalidated device data.  Last Pain:  Vitals:   02/03/22 1047  TempSrc: Oral  PainSc: 0-No pain         Complications: No notable events documented.

## 2022-02-03 NOTE — Lactation Note (Signed)
This note was copied from a baby's chart. Lactation Consultation Note  Patient Name: Yesenia Velasquez [id number] Date: 02/03/2022 Reason for consult: Initial assessment;Breastfeeding assistance;Early term 37-38.6wks;Other (Comment) (Visually impaired) Age:42 hours  LC entered the room and the infant was in the bassinet.  The support person asked the LC to assist with swaddling the infant.  The infant had a wet diaper.  LC changed the wet diaper and assisted the birth parent with putting the infant to the breast.  We attempted in the cross-cradle and the football position.  The birth parent is visually impaired, so the football position seemed easier for her to feel the infant's mouth.  The birth parent was becoming frustrated because the infant did not want to latch.  LC spoke with the birth parent about infant behavior.  LC encouraged the birth parent and told her to be patient with herself and her baby.  LC reviewed feeding cues and outpatient services.  The birth parent had no further questions.   Infant Feeding Plan:  Breastfeed 8+ times in 24 hours according to feeding cues.  Hand express and spoon feed expressed milk to the infant via a spoon.  Call [place] for assistance with breastfeeding.   Maternal Data Has patient been taught Hand Expression?: Yes Does the patient have breastfeeding experience prior to this delivery?: Yes How long did the patient breastfeed?: She breast fed for 1 year  Feeding Mother's Current Feeding Choice: Breast Milk and Formula  LATCH Score Latch: Grasps breast easily, tongue down, lips flanged, rhythmical sucking.  Audible Swallowing: Spontaneous and intermittent  Type of Nipple: Everted at rest and after stimulation  Comfort (Breast/Nipple): Soft / non-tender  Hold (Positioning): Assistance needed to correctly position infant at breast and maintain latch.  LATCH Score: 9   Lactation Tools Discussed/Used    Interventions Interventions:  Breast feeding basics reviewed;Assisted with latch;Skin to skin;Adjust position;Support pillows;Education;LC Services brochure  Discharge Pump: DEBP;Personal  Consult Status Consult Status: Follow-up Date: 02/04/22 Follow-up type: In-patient    Lysbeth Penner 02/03/2022, 6:58 PM

## 2022-02-03 NOTE — H&P (Signed)
LABOR AND DELIVERY ADMISSION HISTORY AND PHYSICAL NOTE  Yesenia Velasquez is a 42 y.o. female G2P1001 with IUP at 51w0dby L/19 presenting for scheduled repeat cesarean section.   She reports positive fetal movement. She denies leakage of fluid or vaginal bleeding.  Prenatal History/Complications:  Past Medical History: Past Medical History:  Diagnosis Date   Blindness    Hypertension     Past Surgical History: Past Surgical History:  Procedure Laterality Date   CESAREAN SECTION     left eye surgery     WISDOM TOOTH EXTRACTION      Obstetrical History: OB History     Gravida  2   Para  1   Term  1   Preterm  0   AB  0   Living  1      SAB  0   IAB  0   Ectopic  0   Multiple  0   Live Births  1           Social History: Social History   Socioeconomic History   Marital status: Single    Spouse name: Not on file   Number of children: Not on file   Years of education: Not on file   Highest education level: Not on file  Occupational History   Not on file  Tobacco Use   Smoking status: Never   Smokeless tobacco: Never  Vaping Use   Vaping Use: Never used  Substance and Sexual Activity   Alcohol use: Not Currently    Comment: SOCIALLY   Drug use: Not Currently   Sexual activity: Yes    Birth control/protection: None  Other Topics Concern   Not on file  Social History Narrative   Not on file   Social Determinants of Health   Financial Resource Strain: Low Risk  (04/16/2020)   Overall Financial Resource Strain (CARDIA)    Difficulty of Paying Living Expenses: Not hard at all  Food Insecurity: No Food Insecurity (04/16/2020)   Hunger Vital Sign    Worried About Running Out of Food in the Last Year: Never true    RLa Joyain the Last Year: Never true  Transportation Needs: No Transportation Needs (04/16/2020)   PRAPARE - THydrologist(Medical): No    Lack of Transportation (Non-Medical): No  Physical  Activity: Inactive (04/16/2020)   Exercise Vital Sign    Days of Exercise per Week: 0 days    Minutes of Exercise per Session: 0 min  Stress: No Stress Concern Present (04/16/2020)   FRaymond   Feeling of Stress : Not at all  Social Connections: Not on file    Family History: Family History  Problem Relation Age of Onset   Hypertension Mother    Hypertension Father    Hypertension Sister    Hypertension Brother    Hypertension Maternal Aunt    Hypertension Maternal Uncle    Diabetes Maternal Uncle    Cancer Maternal Uncle    Hypertension Maternal Grandmother    Diabetes Maternal Grandmother    Heart disease Maternal Grandfather    Asthma Neg Hx     Allergies: No Known Allergies  Medications Prior to Admission  Medication Sig Dispense Refill Last Dose   aspirin EC 81 MG tablet Take 81 mg by mouth in the morning.   02/02/2022   calcium carbonate (TUMS) 500 MG chewable tablet Chew 1  tablet (200 mg of elemental calcium total) by mouth as needed for indigestion or heartburn. Follow directions on bottle   Past Month   doxylamine, Sleep, (UNISOM) 25 MG tablet Take 25 mg by mouth at bedtime as needed for sleep.   02/02/2022   NIFEdipine (PROCARDIA-XL/NIFEDICAL-XL) 30 MG 24 hr tablet Take 1 tablet (30 mg total) by mouth daily. 30 tablet 5 02/03/2022   Prenatal 27-1 MG TABS Take 1 tablet by mouth daily. 90 tablet 3 02/02/2022     Review of Systems   All systems reviewed and negative except as stated in HPI  Blood pressure (!) 130/92, pulse (!) 141, temperature 98.1 F (36.7 C), temperature source Oral, resp. rate 20, height '5\' 3"'$  (1.6 m), weight 74.8 kg, last menstrual period 05/13/2021. General appearance: alert, cooperative, and appears stated age Lungs: clear to auscultation bilaterally Heart: regular rate and rhythm Abdomen: soft, non-tender; bowel sounds normal Extremities: No calf swelling or  tenderness Presentation: cephalic Fetal monitoring: 130s Uterine activity: quiet     Prenatal labs: ABO, Rh: --/--/PENDING (01/10 1108) Antibody: NEG (01/08 0953) Rubella: 1.06 (07/10 0951) RPR: NON REACTIVE (01/08 0954)  HBsAg: Negative (07/10 0951)  HIV: Non Reactive (10/25 0836)  GBS: Negative/-- (12/28 1449)  2 hr Glucola: wnl Genetic screening:  wnl Anatomy US: wnl  Prenatal Transfer Tool  Maternal Diabetes: No Genetic Screening: Normal Maternal Ultrasounds/Referrals: Normal Fetal Ultrasounds or other Referrals:  None Maternal Substance Abuse:  No Significant Maternal Medications:  aspirin, nifedipine Significant Maternal Lab Results: Group B Strep negative  Results for orders placed or performed during the hospital encounter of 02/03/22 (from the past 24 hour(s))  ABO/Rh   Collection Time: 02/03/22 11:08 AM  Result Value Ref Range   ABO/RH(D) PENDING     Patient Active Problem List   Diagnosis Date Noted   Chronic hypertension affecting pregnancy 08/03/2021   History of cesarean delivery affecting pregnancy 08/03/2021   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/03/2021   Supervision of high risk pregnancy, antepartum 07/23/2021   AMA (advanced maternal age) multigravida 29+, third trimester 07/23/2021   Essential hypertension 04/06/2018   Congenital glaucoma 04/30/2014   Rhegmatogenous retinal detachment 07/18/2012    Assessment: Yesenia Velasquez is a 42 y.o. G2P1001 at 45w0dhere for scheduled repeat cesarean section at this gestational age per MFM rec given AMA and chtn on meds.  I did discuss with her risks/benefits of TOLAC vs second cesarean and patient adamant she wants a repeat cesarean.  The risks of cesarean section were discussed with the patient including but were not limited to: bleeding which may require transfusion or reoperation; infection which may require antibiotics; injury to bowel, bladder, ureters or other surrounding organs; injury to the fetus; need for  additional procedures including hysterectomy in the event of a life-threatening hemorrhage; placental abnormalities wth subsequent pregnancies, incisional problems, thromboembolic phenomenon and other postoperative/anesthesia complications.  Patient also desires permanent sterilization.  Other reversible forms of contraception were discussed with patient; she declines all other modalities. Risks of procedure discussed with patient including but not limited to: risk of regret, permanence of method, bleeding, infection, injury to surrounding organs and need for additional procedures.  Failure risk of 1-2% with increased risk of ectopic gestation if pregnancy occurs was also discussed with patient.  The patient concurred with the proposed plan, giving informed written consent for the procedures.  Discussed pros and cons of BTL vs BTS and patient opts for the latter. Patient has been NPO since midnight she will remain NPO  for procedure. Anesthesia and OR aware.  Preoperative prophylactic antibiotics and SCDs ordered on call to the OR.  To OR when ready.  #Pain: Plan for spinal #FWB: FHTs wnl #ID:  Pre-op abx ancef #MOF: breast/bottle #MOC: BTS at time of surgery #Circ:  N/a #cHTN: reports bp well controlled on hctz 25 prior to pregnancy will plan on starting that post-partum #Legally blind  Desma Maxim 02/03/2022, 11:33 AM

## 2022-02-03 NOTE — Anesthesia Procedure Notes (Signed)
Spinal  Patient location during procedure: OR Start time: 02/03/2022 1:43 PM End time: 02/03/2022 1:46 PM Reason for block: surgical anesthesia Staffing Performed: anesthesiologist  Anesthesiologist: Audry Pili, MD Performed by: Audry Pili, MD Authorized by: Audry Pili, MD   Preanesthetic Checklist Completed: patient identified, IV checked, risks and benefits discussed, surgical consent, monitors and equipment checked, pre-op evaluation and timeout performed Spinal Block Patient position: sitting Prep: DuraPrep Patient monitoring: heart rate, cardiac monitor, continuous pulse ox and blood pressure Approach: midline Location: L3-4 Injection technique: single-shot Needle Needle type: Pencan  Needle gauge: 24 G Additional Notes Consent was obtained prior to the procedure with all questions answered and concerns addressed. Risks including, but not limited to, bleeding, infection, nerve damage, paralysis, failed block, inadequate analgesia, allergic reaction, high spinal, itching, and headache were discussed and the patient wished to proceed. Functioning IV was confirmed and monitors were applied. Sterile prep and drape, including hand hygiene, mask, and sterile gloves were used. The patient was positioned and the spine was prepped. The skin was anesthetized with lidocaine. Free flow of clear CSF was obtained prior to injecting local anesthetic into the CSF. The spinal needle aspirated freely following injection. The needle was carefully withdrawn. The patient tolerated the procedure well.   Renold Don, MD

## 2022-02-04 ENCOUNTER — Encounter: Payer: Self-pay | Admitting: Obstetrics and Gynecology

## 2022-02-04 ENCOUNTER — Encounter (HOSPITAL_COMMUNITY): Payer: Self-pay | Admitting: Obstetrics and Gynecology

## 2022-02-04 LAB — CBC
HCT: 31.5 % — ABNORMAL LOW (ref 36.0–46.0)
Hemoglobin: 10.8 g/dL — ABNORMAL LOW (ref 12.0–15.0)
MCH: 30.9 pg (ref 26.0–34.0)
MCHC: 34.3 g/dL (ref 30.0–36.0)
MCV: 90.3 fL (ref 80.0–100.0)
Platelets: 198 10*3/uL (ref 150–400)
RBC: 3.49 MIL/uL — ABNORMAL LOW (ref 3.87–5.11)
RDW: 14.2 % (ref 11.5–15.5)
WBC: 12.4 10*3/uL — ABNORMAL HIGH (ref 4.0–10.5)
nRBC: 0 % (ref 0.0–0.2)

## 2022-02-04 LAB — ABO/RH: ABO/RH(D): O POS

## 2022-02-04 MED ORDER — FERROUS SULFATE 325 (65 FE) MG PO TABS
325.0000 mg | ORAL_TABLET | ORAL | Status: DC
  Administered 2022-02-04: 325 mg via ORAL
  Filled 2022-02-04: qty 1

## 2022-02-04 NOTE — Anesthesia Postprocedure Evaluation (Signed)
Anesthesia Post Note  Patient: Yesenia Velasquez  Procedure(s) Performed: CESAREAN SECTION WITH BILATERAL TUBAL LIGATION     Patient location during evaluation: PACU Anesthesia Type: Spinal Level of consciousness: awake and alert Pain management: pain level controlled Vital Signs Assessment: post-procedure vital signs reviewed and stable Respiratory status: spontaneous breathing and respiratory function stable Cardiovascular status: blood pressure returned to baseline, stable and tachycardic Postop Assessment: spinal receding and no apparent nausea or vomiting Anesthetic complications: no   No notable events documented.  Last Vitals:  Vitals:   02/04/22 0300 02/04/22 0559  BP: 109/73 101/69  Pulse: (!) 105 82  Resp: 17 16  Temp: 36.6 C 36.6 C  SpO2: 99% 100%    Last Pain:  Vitals:   02/04/22 0559  TempSrc: Oral  PainSc: 0-No pain                 Audry Pili

## 2022-02-04 NOTE — Plan of Care (Signed)

## 2022-02-04 NOTE — Social Work (Signed)
CSW received consult for hx of Anxiety.  CSW met with MOB to offer support and complete assessment.  CSW entered the room and observed MOB resting in bed, and MOB mom caring for the infant. CSW introduced self, CSW role and reason for visit. MOB was agreeable to visit and allowed her mom to remain in the room. CSW inquired about how MOB was feeling, MO reported alright.  CSW inquired about MOB MH hx, MOB reported she dealt with some anxiety toward the end of her pregnancy. CSW inquired was it triggered by something specific. MOB reported she had a lot going on with trying to prepare for the baby, anticipation the delivery as well as being "older and having another child." CSW inquired abut treatment, MOB reported she does not take any medication nor is she involved in therapy. CSW inquired about how MOB copes with symptoms, MOB repotted she talk with family and friends. CSW assessed for safety, MOB denied any SI or HI. CSW provided education regarding the baby blues period vs. perinatal mood disorders, discussed treatment and gave resources for mental health follow up if concerns arise.  CSW recommends self-evaluation during the postpartum time period using the New Mom Checklist from Postpartum Progress and encouraged MOB to contact a medical professional if symptoms are noted at any time.  CSW inquired about supports, MOB reported she has a strong support system consisting of her mother who was present, siblings, her son and other family members. MOB reported her mom is currently living with her top help.   CSW provided review of Sudden Infant Death Syndrome (SIDS) precautions.  MOB identified [facility] for infants follow up care. MOB reported she has all necessary ites for the infant including a bassinet for her to sleep. CSW identifies no further need for intervention and no barriers to discharge at this time.  Letta Kocher, [place] Social Worker [phone]

## 2022-02-04 NOTE — Progress Notes (Signed)
POSTPARTUM PROGRESS NOTE  POD #1  Subjective:  LOTOYA CASELLA is a 42 y.o. G2P2002 s/p rLTCS at [redacted]w[redacted]d  She reports she doing well. Had intense anxiety yesterday evening surrounding several issues. She had 1 dose of ativan and that helped tremendously. Doing better today. She denies any problems with ambulating, voiding or po intake. Denies nausea or vomiting. She is unsure about whether she has passed flatus. Pain is well controlled.  Lochia is minimal.  Objective: Blood pressure 101/69, pulse 82, temperature 97.9 F (36.6 C), temperature source Oral, resp. rate 16, height '5\' 3"'$  (1.6 m), weight 74.8 kg, last menstrual period 05/13/2021, SpO2 100 %, unknown if currently breastfeeding.  Physical Exam:  General: alert, cooperative and no distress Chest: no respiratory distress Heart:regular rate, distal pulses intact Abdomen: soft, nontender,  Uterine Fundus: firm, appropriately tender DVT Evaluation: No calf swelling or tenderness Extremities: no edema Skin: warm, dry; incision clean/dry/intact w/ honeycomb dressing in place  Recent Labs    02/01/22 0954 02/04/22 0333  HGB 13.2 10.8*  HCT 38.2 31.5*    Assessment/Plan: TMILIANNA ERICSSONis a 42y.o. [id number]/p rLTCS at 370w0due to previous C-section, elective repeat.  POD#1 - Doing welll; pain is well controlled. H/H appropriate  Routine postpartum care  OOB, ambulated  Lovenox for VTE prophylaxis Mild Anemia: asymptomatic  Start po ferrous sulfate every other day cHTN - on amlodipine '10mg'$  daily, lasix. Blood pressures well controlled  Anxiety: she is better this morning. No prior history of anxiety /depression, except in the last 3 months of her pregnancy when she has had anxiety.  We discussed starting medications to help, she will rather hold off for now.  - she will need a 2 week mood check in office post partum.  Contraception: s/p BTL. Feeding: breast and bottle   Dispo: Plan for discharge in 1-2 days.   LOS:  1 day   ChLiliane ChannelD MPH OB Fellow, [place]/11/2022

## 2022-02-04 NOTE — Social Work (Signed)
CSW acknowledges consult for EPDS score of 14, MOB has been seen by CSW and mental health concerns have been addressed and resources provided.   Letta Kocher, [place] Social Worker [phone]

## 2022-02-05 ENCOUNTER — Other Ambulatory Visit (HOSPITAL_COMMUNITY): Payer: Self-pay

## 2022-02-05 DIAGNOSIS — F32A Depression, unspecified: Secondary | ICD-10-CM | POA: Insufficient documentation

## 2022-02-05 LAB — SURGICAL PATHOLOGY

## 2022-02-05 LAB — BIRTH TISSUE RECOVERY COLLECTION (PLACENTA DONATION)

## 2022-02-05 MED ORDER — SENNOSIDES-DOCUSATE SODIUM 8.6-50 MG PO TABS
2.0000 | ORAL_TABLET | ORAL | 0 refills | Status: AC
  Filled 2022-02-05: qty 30, 15d supply, fill #0

## 2022-02-05 MED ORDER — ACETAMINOPHEN 500 MG PO TABS
1000.0000 mg | ORAL_TABLET | Freq: Four times a day (QID) | ORAL | 0 refills | Status: DC
  Filled 2022-02-05: qty 30, 4d supply, fill #0

## 2022-02-05 MED ORDER — SERTRALINE HCL 50 MG PO TABS
50.0000 mg | ORAL_TABLET | Freq: Every day | ORAL | Status: DC
  Administered 2022-02-05: 50 mg via ORAL
  Filled 2022-02-05: qty 1

## 2022-02-05 MED ORDER — IBUPROFEN 600 MG PO TABS
600.0000 mg | ORAL_TABLET | Freq: Four times a day (QID) | ORAL | 0 refills | Status: DC
  Filled 2022-02-05: qty 30, 8d supply, fill #0

## 2022-02-05 MED ORDER — HYDROXYZINE HCL 50 MG PO TABS
50.0000 mg | ORAL_TABLET | Freq: Three times a day (TID) | ORAL | 1 refills | Status: DC | PRN
  Filled 2022-02-05: qty 45, 15d supply, fill #0

## 2022-02-05 MED ORDER — FERROUS SULFATE 325 (65 FE) MG PO TABS
325.0000 mg | ORAL_TABLET | ORAL | 1 refills | Status: AC
  Filled 2022-02-05: qty 45, 90d supply, fill #0

## 2022-02-05 MED ORDER — SERTRALINE HCL 50 MG PO TABS
50.0000 mg | ORAL_TABLET | Freq: Every day | ORAL | 2 refills | Status: DC
  Filled 2022-02-05: qty 30, 30d supply, fill #0

## 2022-02-05 NOTE — Lactation Note (Signed)
This note was copied from a baby's chart. Lactation Consultation Note  Patient Name: Yesenia Velasquez HKFEX'M Date: 02/05/2022 Reason for consult: Follow-up assessment;Early term 37-38.6wks Age:42 hours   P2: Early term infant at 38+0 weeks Feeding preference: Breast/formula Weight loss: 4%  "Yesenia Velasquez" was asleep in grandmother's arms when I arrived.  Mother had no specific questions/concerns related to breast feeding.  She is somewhat anxious that her milk has not "come in" yet like it did 16 years ago with her first child.  Education completed on milk coming to volume.  Encouraged her to continue lots of STS, breast massage and hand expression to help ensure a good milk supply.  Reminded her to always breast feed "Yesenia Velasquez" prior to giving formula supplementation.  Mother plans to continue to supplement.  Encouraged to call for any questions/concerns after discharge.  She has a good support system and her mother is very supportive.  Mother has an appointment with the [facility] department on January 24.  Explained that peer counselors are available for assistance with breast feeding if needed.   Maternal Data    Feeding Mother's Current Feeding Choice: Breast Milk and Formula  LATCH Score                    Lactation Tools Discussed/Used    Interventions Interventions: Education  Discharge Discharge Education: Engorgement and breast care Pump: Personal;Manual WIC Program:  (Mother has an appt on Jan.24)  Consult Status Consult Status: Complete Date: 02/05/22 Follow-up type: Call as needed    Lanice Schwab [place] 02/05/2022, 8:46 AM

## 2022-02-05 NOTE — Progress Notes (Addendum)
Patient ID: Yesenia Velasquez, female   DOB: 19-Jun-1980, 42 y.o.   MRN: [id number]  Yesenia Velasquez [id number] Postpartum Day 2 S/P  Elective Repeat Cesarean Section with BTS  Subjective: Patient up ad lib, denies syncope or dizziness. Reports consuming regular diet without issues and denies N/V. Patient reports no bowel movement, but is  passing flatus.  Denies issues with urination and reports bleeding is "okay."  Patient is breast and bottle feeding and reports going well.  BTL for postpartum contraception completed.  Pain is being appropriately managed with use of tylenol and motrin. Patient endorses continued anxiety, but states "it is nothing yall can do about it."  However, patient family feels she did well with ativan dosing yesterday and patient is agreeable to medications.   Objective: Temp:  [97.7 F (36.5 C)-98.3 F (36.8 C)] 98.3 F (36.8 C) (01/12 0519) Pulse Rate:  [89-99] 99 (01/12 0519) Resp:  [16-18] 18 (01/12 0519) BP: (101-128)/(67-86) 126/86 (01/12 0519) SpO2:  [99 %-100 %] 100 % (01/12 0519)  Recent Labs    02/04/22 0333  HGB 10.8*  HCT 31.5*  WBC 12.4*    Physical Exam:  General: alert, cooperative, and no distress Mood/Affect: Apathetic/Congruent Lungs: clear to auscultation, no wheezes, rales or rhonchi, symmetric air entry.  Heart: normal rate and regular rhythm. Breast: not examined. Abdomen:  + bowel sounds, Soft, Appropriately Tender, Distended Incision: healing well, Honeycomb dressing intact and without drainage.  Uterine Fundus: firm, U/-2 Lochia: appropriate Skin: Warm, Dry. DVT Evaluation: No evidence of DVT seen on physical exam. No significant calf/ankle edema. JP drain:   None  Assessment Post Operative Day 3 S/P Repeat C/S Normal Involution Breast/BottleFeeding Hemodynamically Stable Anxiety  Plan: -Reviewed usage of Vistaril and Zoloft at home. -Will start Zoloft dosing today at '50mg'$ . Patient agreeable. -Plan to follow up in  office in one week for mood check along with incision and bp check. -BP normotensive and Amlodipine held yesterday.  Patient instructed to restart procardia dosing at home tomorrow.  -Reviewed upcoming appts: 1/17, 1/31, and 2/21 -Continue other mgmt as ordered -L&D updated on patient status  Yesenia Conners MSN, CNM 02/05/2022, 9:30 AM

## 2022-02-10 ENCOUNTER — Ambulatory Visit (INDEPENDENT_AMBULATORY_CARE_PROVIDER_SITE_OTHER): Payer: BC Managed Care – PPO

## 2022-02-10 VITALS — BP 131/96 | HR 82

## 2022-02-10 DIAGNOSIS — F418 Other specified anxiety disorders: Secondary | ICD-10-CM | POA: Diagnosis not present

## 2022-02-10 DIAGNOSIS — Z0130 Encounter for examination of blood pressure without abnormal findings: Secondary | ICD-10-CM

## 2022-02-10 DIAGNOSIS — O99345 Other mental disorders complicating the puerperium: Secondary | ICD-10-CM

## 2022-02-10 NOTE — Progress Notes (Signed)
Blood Pressure Check Visit  Yesenia Velasquez is here for blood pressure check following C-section without labor on 02/03/2022. BP today is 136/90, 131/96 . Patient is currently prescribed Procardia '30mg'$  daily. Pt states she has not taken medication in over 24 hours. Patient denies any dizzness blurred vision headache chest pain shortness of breath peripheral edema. Reviewed with J. Arnold MD. He advised patient continue to take Procardia '30mg'$  and follow up at [facility] appointment.   Incision Check Visit  Yesenia Velasquez is here for incision check following repeat c-section on 02/03/2022.   Assessment: Steri Strips removed. Incision is clean, dry, intact.   Education: Reviewed good wound care and s/s of infection with patient.  Patient will follow up post partum visit . PP visit scheduled for 03/17/2022. Pt aware.   Patients mother is here with her today. Patients mother is requesting an appointment with behavioral health. She states patient is struggling with anxiety. Pt very quiet and will neither confirm nor deny she desires appointment with [facility] or is struggling with anxiety. RN offered referral so she can call and request appointment or go ahead an make appointment and cancel if she so desires. Also offered [facility] information so patient had back up in case she needed it. Pt agreeable. Upon chart review pt did not make appointment with [facility] at check out. Referral is placed.   Pt also states she does not believe her milk has come in. However, pt currently using cabbage leaves to dry up milk. Pt requesting more information on how to stop milk production. RN advised. Then patient stated she was unsure if she wanted to stop breastfeeding. RN offered to make patient appointment with lactation before she took further steps to dry up milk. Pt indifferent and would neither confirm nor deny she wanted appointment. Placed request for appointment in wrap-up in case patient changed her mind.   RN repeatedly  asked pt if there was anything she could help the patient with or questions she could answer. Pt denied further questions.   Martina Sinner, RN 02/10/2022  1:18 PM

## 2022-02-12 NOTE — BH Specialist Note (Deleted)
Integrated Behavioral Health via Telemedicine Visit  02/12/2022 HELENNA ZAYED [id number]  Number of [facility] Clinician visits: 1- Initial Visit  Session Start time: [id number]   Session End time: [id number]  Total time in minutes: 32   Referring Provider: Emeterio Reeve, MD Patient/Family location: Home*** [facility] Provider location: [facility] for [place] [facility]  All persons participating in visit: Patient Yesenia Velasquez and [place] ***  Types of Service: {CHL AMB TYPE OF SERVICE:[phone]}  I connected with [name] and/or Theodoro Parma A Cansler's {family members:[id number]} via  Telephone or Video Enabled Telemedicine Application  (Video is Caregility application) and verified that I am speaking with the correct person using two identifiers. Discussed confidentiality: Yes   I discussed the limitations of telemedicine and the availability of in person appointments.  Discussed there is a possibility of technology failure and discussed alternative modes of communication if that failure occurs.  I discussed that engaging in this telemedicine visit, they consent to the provision of behavioral healthcare and the services will be billed under their insurance.  Patient and/or legal guardian expressed understanding and consented to Telemedicine visit: Yes   Presenting Concerns: Patient and/or family reports the following symptoms/concerns: *** Duration of problem: ***; Severity of problem: {Mild/Moderate/Severe:20260}  Patient and/or Family's Strengths/Protective Factors: {CHL AMB BH PROTECTIVE FACTORS:[phone]}  Goals Addressed: Patient will:  Reduce symptoms of: {IBH Symptoms:21014056}   Increase knowledge and/or ability of: {IBH Patient Tools:21014057}   Demonstrate ability to: {IBH Goals:21014053}  Progress towards Goals: {CHL AMB BH PROGRESS TOWARDS GOALS:[phone]}  Interventions: Interventions utilized:  {IBH  Interventions:21014054} Standardized Assessments completed: {IBH Screening Tools:21014051}  Patient and/or Family Response: Patient agrees with treatment plan.   Assessment: Patient currently experiencing ***.   Patient may benefit from continued therapeutic intervention***.  Plan: Follow up with behavioral health clinician on : *** Behavioral recommendations:  -*** -*** Referral(s): {IBH Referrals:21014055}  I discussed the assessment and treatment plan with the patient and/or parent/guardian. They were provided an opportunity to ask questions and all were answered. They agreed with the plan and demonstrated an understanding of the instructions.   They were advised to call back or seek an in-person evaluation if the symptoms worsen or if the condition fails to improve as anticipated.  Garlan Fair, LCSW     11/18/2021   11:15 AM 09/07/2021    4:47 PM 08/03/2021    9:06 AM 04/16/2020    3:06 PM 04/11/2019    2:43 PM  Depression screen PHQ 2/9  Decreased Interest 3 0 3 0 0  Down, Depressed, Hopeless 3 0 0 0 0  PHQ - 2 Score 6 0 3 0 0  Altered sleeping 3 0 3  0  Tired, decreased energy 3 0 3  0  Change in appetite 3 0 2  0  Feeling bad or failure about yourself  3 0 3  0  Trouble concentrating 3 0 0  0  Moving slowly or fidgety/restless 2 0 0  0  Suicidal thoughts 2 0 0  0  PHQ-9 Score 25 0 14  0  Difficult doing work/chores     Not difficult at all      11/18/2021   11:15 AM 09/07/2021    4:47 PM 08/03/2021    9:07 AM  GAD 7 : Generalized Anxiety Score  Nervous, Anxious, on Edge 3 0 0  Control/stop worrying 3 0 0  Worry too much - different things 3 0 0  Trouble relaxing 3 0 0  Restless 2 0 0  Easily annoyed or irritable 2 0 0  Afraid - awful might happen 3 0 0  Total GAD 7 Score 19 0 0

## 2022-02-16 ENCOUNTER — Ambulatory Visit (HOSPITAL_COMMUNITY)
Admission: EM | Admit: 2022-02-16 | Discharge: 2022-02-17 | Disposition: A | Discharge status: Other Acute Inpatient Hospital | Payer: BC Managed Care – PPO | Attending: Psychiatry | Admitting: Psychiatry

## 2022-02-16 DIAGNOSIS — F419 Anxiety disorder, unspecified: Secondary | ICD-10-CM | POA: Insufficient documentation

## 2022-02-16 DIAGNOSIS — F53 Postpartum depression: Secondary | ICD-10-CM | POA: Diagnosis not present

## 2022-02-16 DIAGNOSIS — Z79899 Other long term (current) drug therapy: Secondary | ICD-10-CM | POA: Insufficient documentation

## 2022-02-16 DIAGNOSIS — R45851 Suicidal ideations: Secondary | ICD-10-CM | POA: Diagnosis not present

## 2022-02-16 DIAGNOSIS — Z1152 Encounter for screening for COVID-19: Secondary | ICD-10-CM | POA: Insufficient documentation

## 2022-02-16 DIAGNOSIS — H548 Legal blindness, as defined in USA: Secondary | ICD-10-CM | POA: Diagnosis present

## 2022-02-16 DIAGNOSIS — Z98891 History of uterine scar from previous surgery: Secondary | ICD-10-CM | POA: Insufficient documentation

## 2022-02-16 LAB — COMPREHENSIVE METABOLIC PANEL
ALT: 20 U/L (ref 0–44)
AST: 19 U/L (ref 15–41)
Albumin: 4 g/dL (ref 3.5–5.0)
Alkaline Phosphatase: 96 U/L (ref 38–126)
Anion gap: 14 (ref 5–15)
BUN: 10 mg/dL (ref 6–20)
CO2: 22 mmol/L (ref 22–32)
Calcium: 9.2 mg/dL (ref 8.9–10.3)
Chloride: 102 mmol/L (ref 98–111)
Creatinine, Ser: 0.78 mg/dL (ref 0.44–1.00)
GFR, Estimated: >60 mL/min (ref >60)
Glucose, Bld: 80 mg/dL (ref 70–99)
Potassium: 3.2 mmol/L — ABNORMAL LOW (ref 3.5–5.1)
Sodium: 138 mmol/L (ref 135–145)
Total Bilirubin: 0.9 mg/dL (ref 0.3–1.2)
Total Protein: 6.8 g/dL (ref 6.5–8.1)

## 2022-02-16 LAB — CBC WITH DIFFERENTIAL/PLATELET
Abs Immature Granulocytes: 0.01 10*3/uL (ref 0.00–0.07)
Basophils Absolute: 0 10*3/uL (ref 0.0–0.1)
Basophils Relative: 1 %
Eosinophils Absolute: 0 10*3/uL (ref 0.0–0.5)
Eosinophils Relative: 0 %
HCT: 39 % (ref 36.0–46.0)
Hemoglobin: 13.7 g/dL (ref 12.0–15.0)
Immature Granulocytes: 0 %
Lymphocytes Relative: 20 %
Lymphs Abs: 1.5 10*3/uL (ref 0.7–4.0)
MCH: 31.2 pg (ref 26.0–34.0)
MCHC: 35.1 g/dL (ref 30.0–36.0)
MCV: 88.8 fL (ref 80.0–100.0)
Monocytes Absolute: 0.4 10*3/uL (ref 0.1–1.0)
Monocytes Relative: 6 %
Neutro Abs: 5.6 10*3/uL (ref 1.7–7.7)
Neutrophils Relative %: 73 %
Platelets: 339 10*3/uL (ref 150–400)
RBC: 4.39 MIL/uL (ref 3.87–5.11)
RDW: 13.8 % (ref 11.5–15.5)
WBC: 7.6 10*3/uL (ref 4.0–10.5)
nRBC: 0 % (ref 0.0–0.2)

## 2022-02-16 LAB — POC SARS CORONAVIRUS 2 AG: SARSCOV2ONAVIRUS 2 AG: NEGATIVE

## 2022-02-16 LAB — TSH: TSH: 1.021 u[IU]/mL (ref 0.350–4.500)

## 2022-02-16 LAB — POCT URINE DRUG SCREEN - MANUAL ENTRY (I-SCREEN)
POC Amphetamine UR: NOT DETECTED
POC Buprenorphine (BUP): NOT DETECTED
POC Cocaine UR: NOT DETECTED
POC Marijuana UR: NOT DETECTED
POC Methadone UR: NOT DETECTED
POC Methamphetamine UR: NOT DETECTED
POC Morphine: NOT DETECTED
POC Oxazepam (BZO): NOT DETECTED
POC Oxycodone UR: NOT DETECTED
POC Secobarbital (BAR): NOT DETECTED

## 2022-02-16 LAB — HEMOGLOBIN A1C
Hgb A1c MFr Bld: 4.8 % (ref 4.8–5.6)
Mean Plasma Glucose: 91.06 mg/dL

## 2022-02-16 LAB — POCT PREGNANCY, URINE: Preg Test, Ur: POSITIVE — AB

## 2022-02-16 LAB — RESP PANEL BY RT-PCR (RSV, FLU A&B, COVID)  RVPGX2
Influenza A by PCR: NEGATIVE
Influenza B by PCR: NEGATIVE
Resp Syncytial Virus by PCR: NEGATIVE
SARS Coronavirus 2 by RT PCR: NEGATIVE

## 2022-02-16 LAB — LIPID PANEL
Cholesterol: 209 mg/dL — ABNORMAL HIGH (ref 0–200)
HDL: 100 mg/dL (ref >40)
LDL Cholesterol: 92 mg/dL (ref 0–99)
Total CHOL/HDL Ratio: 2.1 RATIO
Triglycerides: 85 mg/dL (ref <150)
VLDL: 17 mg/dL (ref 0–40)

## 2022-02-16 LAB — ETHANOL: Alcohol, Ethyl (B): <10 mg/dL (ref <10)

## 2022-02-16 LAB — MAGNESIUM: Magnesium: 2.3 mg/dL (ref 1.7–2.4)

## 2022-02-16 MED ORDER — OLANZAPINE 5 MG PO TBDP: 5.0000 mg | ORAL_TABLET | Freq: Two times a day (BID) | ORAL | Status: AC

## 2022-02-16 MED ORDER — MAGNESIUM HYDROXIDE 400 MG/5ML PO SUSP: 30.0000 mL | Freq: Every day | ORAL | Status: DC | PRN

## 2022-02-16 MED ORDER — LORAZEPAM 1 MG PO TABS: 1.0000 mg | ORAL_TABLET | ORAL | Status: DC | PRN

## 2022-02-16 MED ORDER — ACETAMINOPHEN 325 MG PO TABS: 650.0000 mg | ORAL_TABLET | Freq: Four times a day (QID) | ORAL | Status: DC | PRN

## 2022-02-16 MED ORDER — OLANZAPINE 5 MG PO TBDP: 5.0000 mg | ORAL_TABLET | Freq: Three times a day (TID) | ORAL | Status: DC | PRN

## 2022-02-16 MED ORDER — ZIPRASIDONE MESYLATE 20 MG IM SOLR: 20.0000 mg | INTRAMUSCULAR | Status: DC | PRN

## 2022-02-16 MED ORDER — HYDROXYZINE HCL 25 MG PO TABS
25.0000 mg | ORAL_TABLET | Freq: Three times a day (TID) | ORAL | Status: DC | PRN
  Administered 2022-02-16: 25 mg via ORAL
  Filled 2022-02-16: qty 1

## 2022-02-16 MED ORDER — OLANZAPINE 5 MG PO TBDP
5.0000 mg | ORAL_TABLET | Freq: Two times a day (BID) | ORAL | Status: DC
  Administered 2022-02-16 – 2022-02-17 (×3): 5 mg via ORAL
  Filled 2022-02-16 (×3): qty 1

## 2022-02-16 MED ORDER — TRAZODONE HCL 50 MG PO TABS
50.0000 mg | ORAL_TABLET | Freq: Every evening | ORAL | Status: DC | PRN
  Administered 2022-02-16: 50 mg via ORAL
  Filled 2022-02-16: qty 1

## 2022-02-16 MED ORDER — ALUM & MAG HYDROXIDE-SIMETH 200-200-20 MG/5ML PO SUSP: 30.0000 mL | ORAL | Status: DC | PRN

## 2022-02-16 NOTE — Progress Notes (Signed)
Pt was accepted to [facility] 02/17/2022  Pt meets inpatient criteria per Thomes Lolling, NP  Attending Physician will be Elberta Leatherwood, MD  Report can be called to: [phone]  Pt can arrive after 8 AM  Care Team Notified: Thomes Lolling, NP, Lynnda Shields, RN, and Mardene Sayer, RN  [place], [place]  02/16/2022 2:26 PM

## 2022-02-16 NOTE — ED Notes (Signed)
Pt a/o x 4. Denies SI/HI/AVH.  Flat affect, sad. Informed that sister is calling she states she will call her tomorrow. No noted distress. HS meds given. No noted distress. Per facility policy, will continue to monitor for safety

## 2022-02-16 NOTE — ED Notes (Signed)
Rn notified provider of warnings that came up when zyprexa was scan concerning pregancy and lactation and she still advised to give

## 2022-02-16 NOTE — ED Notes (Signed)
Notified provider of blood pressure

## 2022-02-16 NOTE — ED Notes (Signed)
Patient alert and oriented x 3. Denies SI/HI/AVH. Denies intent or plan to harm self or others. Routine conducted according to faculty protocol. Encourage patient to notify staff with any needs or concerns. Patient verbalized agreement and understanding. Will continue to monitor for safety.

## 2022-02-16 NOTE — ED Notes (Signed)
Patient has two positive pregnancy test. Provider is aware. Patient is 2 weeks postpartum.

## 2022-02-16 NOTE — ED Notes (Signed)
Pt sleeping in recliner bed. Resp even and unlabored.No noted distress. Per facility protocol, will continue to monitor for safety

## 2022-02-16 NOTE — ED Notes (Addendum)
Notified provider that patient had two positive pregnancy test. Provider states that it is ok to give patient the Zyprexa that is due,even though pregnancy test are positive.

## 2022-02-16 NOTE — ED Provider Notes (Cosign Needed Addendum)
Behavioral health urgent care Medical Screening Exam  Date: 02/16/22 Patient Name: Yesenia Velasquez MRN: [id number] Chief Complaint: "I haven not slept:"  Diagnoses:  Final diagnoses:  Post partum depression    HPI: patient presented to [facility] as a walk in  accompanied by her sister and other family members with complaints of "I have not slept".  Yesenia Velasquez, 42 y.o., female patient seen face to face by this provider, consulted with [name]; and chart reviewed on 02/16/22.  Per chart review patient has a past psychiatric history of anxiety and depression.  She is 2 weeks postpartum after giving birth by cesarean section.  Reports while she was in the hospital her provider prescribed Zoloft and hydroxyzine.  She denies any history of post partum depression with her first child who is now 28 years old.  She currently has no psychiatric services in place.  She denies any substance use.  Patient is legally blind.  Of note patient's urine pregnancy test is positive.  She denies that there could be any possibility of pregnancy.  She is postpartum by 2 weeks and hCG level is still elevated which contributes to positive pregnancy test.  Patients Sister Theo Dills was present during the evaluation with patient's permission.  Yesenia Velasquez states that since giving birth patient is not eating and has lost weight, roughly 10-15 pounds.  They have to bring food to her and prompted her to eat.  She is not showering and taking care of her ADLs.  She was having difficulty sleeping before giving birth but since giving birth she has only slept roughly 8 hours.  Patient reports she has not slept at all in days.  Sister states all patient does is pace around the house throughout the night and during the day.  She told her sister yesterday what color of flowers she would like at her funeral.  Sister states that the whole family is extremely concerned because Yesenia Velasquez has always been a strong independent woman.  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  States she is normally extremely motivated and this is a significant change from her baseline.  This change began to take place after patient separated from her husband a few months ago.  They had initially separated last year and got back together.  During the brief time they were together patient became pregnant.  During the assessment Yesenia Velasquez is sitting on the edge of the assessment chair.  She appears anxious.  She is casually dressed and makes no eye contact, she is legally blind.  She has clear speech at a normal rate and tone. She is endorsing racing thoughts.  She is slow to respond.  She ruminates throughout the assessment stating, "I regret getting back with him and regret having a baby".  She is hyperfocused on this thought.  She denies any thoughts of wanting to hurt her baby.  She admits to endorsing some suicidal ideations lately but was very withdrawn with questions and not forthcoming.  She never answered confidently that she was not suicidal today.  She does endorse thoughts of wanting to get out of her situation and not wanting to be alive.  She admits that she is depressed and has feelings of fatigue, hopelessness, decrease in appetite, constant worrying, and decreased sleep.  She has a flat affect.  Her insight and judgment are impaired.  She does not appear to be responding to internal/external stimuli.  She is easily distracted.  Discussed inpatient psychiatric treatment due to patient's severe depressive symptoms.  Patient initially agreed to inpatient treatment but changed her mind several times. Patient was placed under IVC by this Probation officer. Findings are as follows:   "Respondant presents almost 2 weeks post partum. Respondant has been uable to sleep for days and constantly paces. Respondant is rumindating on her past relationship and states she regrets getting married and getting pregnant. Respindant has  lost 10-15 pounds from not eating. Respondants family is having to prompt her to eat and shower. Respondant is neglecting her adls. Respondant is endorsing racing thoughts. Respondant has had fequent thoughts of the end of her life and has told family what color flowers she wants at her funernal. Respondant is extremely anxous and is refusing inpatient treatment.  Respondant is experiencing post partum depression and if untreated her condition could worsen".   Total Time spent with patient: 1 hour  Musculoskeletal  Strength & Muscle Tone: within normal limits Gait & Station: normal Patient leans: N/A  Psychiatric Specialty Exam  Presentation General Appearance:  Casual  Eye Contact: Poor (legally blind)  Speech: Clear and Coherent; Normal Rate  Speech Volume: Normal  Handedness: Right   Mood and Affect  Mood: Anxious; Depressed  Affect: Congruent   Thought Process  Thought Processes: Coherent  Descriptions of Associations:Intact  Orientation:Full (Time, Place and Person)  Thought Content:Rumination  Diagnosis of Schizophrenia or Schizoaffective disorder in past: No   Hallucinations:Hallucinations: None  Ideas of Reference:None  Suicidal Thoughts:Suicidal Thoughts: No  Homicidal Thoughts:Homicidal Thoughts: No   Sensorium  Memory: Immediate Good; Recent Good; Remote Good  Judgment: Fair  Insight: Poor   Executive Functions  Concentration: Fair  Attention Span: Fair  Recall: Good  Fund of Knowledge: Good  Language: Good   Psychomotor Activity  Psychomotor Activity: Psychomotor Activity: Normal   Assets  Assets: Desire for Improvement; Communication Skills; Financial Resources/Insurance; Physical Health; Resilience; Social Support; Vocational/Educational   Sleep  Sleep: Sleep: Poor Number of Hours of Sleep: 0   Nutritional Assessment (For OBS and FBC admissions only) Has the patient had a weight loss or gain of 10 pounds  or more in the last 3 months?: Yes Has the patient had a decrease in food intake/or appetite?: Yes Does the patient have dental problems?: No Does the patient have eating habits or behaviors that may be indicators of an eating disorder including binging or inducing vomiting?: No Has the patient recently lost weight without trying?: 1 Has the patient been eating poorly because of a decreased appetite?: 1 Malnutrition Screening Tool Score: 2    Physical Exam Vitals and nursing note reviewed.  Constitutional:      General: She is not in acute distress.    Appearance: Normal appearance. She is not ill-appearing.  HENT:     Head: Normocephalic.  Eyes:     General:        Right eye: No discharge.        Left eye: No discharge.     Conjunctiva/sclera: Conjunctivae normal.  Cardiovascular:     Rate and Rhythm: Normal rate.  Pulmonary:     Effort: Pulmonary effort is normal.  Musculoskeletal:        General: Normal range of motion.     Cervical back: Normal range of motion.  Skin:    Coloration: Skin is not jaundiced or pale.  Neurological:     Mental Status: She is alert and oriented to person, place, and time.  Psychiatric:        Attention and Perception: Attention and perception normal.        Mood and Affect: Affect normal. Mood is anxious and depressed.        Speech: Speech normal.        Behavior: Behavior is cooperative.        Thought Content: Thought content normal.        Cognition and Memory: Cognition normal.        Judgment: Judgment normal.    Review of Systems  Constitutional: Negative.   HENT: Negative.    Eyes: Negative.   Respiratory: Negative.  Negative for cough.   Cardiovascular: Negative.   Genitourinary: Negative.   Musculoskeletal: Negative.   Skin: Negative.   Neurological: Negative.   Psychiatric/Behavioral:  Positive for depression. The patient is nervous/anxious and has insomnia.     Blood pressure (!) 155/100, pulse 100, resp. rate 20,  last menstrual period 05/13/2021, SpO2 100 %, unknown if currently breastfeeding. There is no height or weight on file to calculate BMI.  Past Psychiatric History: anxiety and depression  Is the patient at risk to self? Yes  Has the patient been a risk to self in the past 6 months? No .    Has the patient been a risk to self within the distant past? No   Is the patient a risk to others? No   Has the patient been a risk to others in the past 6 months? No   Has the patient been a risk to others within the distant past? No   Past Medical History: Status post bilateral salpingectomy  Chronic hypertension affecting pregnancy  Status post repeat low transverse cesarean section  Legally blind  Supervision of high risk pregnancy, antepartum  AMA (advanced maternal age) multigravida 43+, third trimester  Essential hypertension  Congenital glaucoma  Rhegmatogenous retinal detachment    Family History: Family   Family history-denies any mental health diagnoses in family Marital status: Separated Separated, when?: unknown What types of issues is patient dealing with in the relationship?: supposed infidelity Additional relationship information: na Does patient have children?: Yes How many children?: 2 (a 26 yo son and a 43 week old daughter) How is patient's relationship with their children?: "go  Social History: denies any substance use   Last Labs:  Admission on 02/03/2022, Discharged on 02/05/2022  Component Date Value Ref Range Status   ABO/RH(D) 02/03/2022 O POS   Final   No rh immune globuloin 02/03/2022    Final                   Value:NOT A RH IMMUNE GLOBULIN CANDIDATE, PT RH POSITIVE Performed at [facility], [address] [place], [place] [address]    SURGICAL PATHOLOGY 02/03/2022    Final-Edited                   Value:SURGICAL PATHOLOGY CASE: [id number] PATIENT: Idalia Needle Surgical Pathology Report     Clinical History: 38w 0d; previous cesarean  section and BTL, undesired fertility (crm)     FINAL MICROSCOPIC DIAGNOSIS:  A.  LEFT FALLOPIAN TUBE, SALPINGECTOMY: Benign fallopian tube  B.  RIGHT FALLOPIAN TUBE, SALPINGECTOMY: Benign fallopian tube   GROSS DESCRIPTION:  Specimen A: Received fresh labeled "left fallopian tube" is a 5.9 cm in length and up to 0.6 cm in diameter fimbriated segment of fallopian tube which has a smooth pink serosa and unremarkable cut surfaces. Representative sections which  include 1 cross-section and fimbriated end bisected lengthwise are submitted 1 block.  Specimen B: Received fresh labeled "right fallopian tube" is a 5.4 cm in length and up to 0.7 cm in diameter fimbriated segment of fallopian tube which has a smooth pink serosa and unremarkable cut surfaces. Representative sections which include 1 cross-section and fimbriated end                          bisected lengthwise are submitted 1 block.  SW 02/04/2022   Final Diagnosis performed by Tobin Chad, MD.   Electronically signed 02/05/2022 Technical component performed at [facility], [place] [address] [place], [place] [address].  Professional component performed at [employer], [place] [address] [place], [place] [address].  Immunohistochemistry Technical component (if applicable) was performed at [facility] [address], [place], [place], [place] [address].   IMMUNOHISTOCHEMISTRY DISCLAIMER (if applicable): Some of these immunohistochemical stains may have been developed and the performance characteristics determine by [facility] Some may not have been cleared or approved by the U.S. Food and Drug Administration. The FDA has determined that such clearance or approval is not necessary. This test is used for clinical purposes. It should not be regarded as investigational or for research. T                         his laboratory is certified under the  [place] (CLIA-88) as qualified to perform high complexity clinical laboratory testing.  The controls stained appropriately.    WBC 02/04/2022 12.4 (H)  4.0 - 10.5 K/uL Final   RBC 02/04/2022 3.49 (L)  3.87 - 5.11 MIL/uL Final   Hemoglobin 02/04/2022 10.8 (L)  12.0 - 15.0 g/dL Final   HCT 02/04/2022 31.5 (L)  36.0 - 46.0 % Final   MCV 02/04/2022 90.3  80.0 - 100.0 fL Final   MCH 02/04/2022 30.9  26.0 - 34.0 pg Final   MCHC 02/04/2022 34.3  30.0 - 36.0 g/dL Final   RDW 02/04/2022 14.2  11.5 - 15.5 % Final   Platelets 02/04/2022 198  150 - 400 K/uL Final   nRBC 02/04/2022 0.0  0.0 - 0.2 % Final   Performed at [facility] [address] [place], [place] [address]   Placenta donation bld collect 02/04/2022 Collected by Laboratory   Final   Performed at [facility], [place] [address] [place], [place] [address]  Hospital Outpatient Visit on 02/01/2022  Component Date Value Ref Range Status   WBC 02/01/2022 9.4  4.0 - 10.5 K/uL Final   RBC 02/01/2022 4.20  3.87 - 5.11 MIL/uL Final   Hemoglobin 02/01/2022 13.2  12.0 - 15.0 g/dL Final   HCT 02/01/2022 38.2  36.0 - 46.0 % Final   MCV 02/01/2022 91.0  80.0 - 100.0 fL Final   MCH 02/01/2022 31.4  26.0 - 34.0 pg Final   MCHC 02/01/2022 34.6  30.0 - 36.0 g/dL Final   RDW 02/01/2022 14.3  11.5 - 15.5 % Final   Platelets 02/01/2022 230  150 - 400 K/uL Final   nRBC 02/01/2022 0.0  0.0 - 0.2 % Final   Performed at [facility], [place] [address], [place], [place] [address]   RPR Ser Ql 02/01/2022 NON REACTIVE  NON REACTIVE Final   Performed at [facility], [place] [address],  [place], [place] [address]   ABO/RH(D) 02/01/2022 O POS   Final   Antibody Screen 02/01/2022 NEG   Final   Sample Expiration 02/01/2022    Final                   Value:02/04/2022,2359 Performed at [facility], [place] [address], [place], [place] [address]   Routine Prenatal on 01/21/2022  Component Date  Value Ref Range Status   Strep Gp B Culture 01/21/2022 Negative  Negative Final   Comment: Centers for Disease Control and Prevention (CDC) and American Congress of Obstetricians and Gynecologists (ACOG) guidelines for prevention of perinatal group B streptococcal (GBS) disease specify co-collection of a vaginal and rectal swab specimen to maximize sensitivity of GBS detection. Per the CDC and ACOG, swabbing both the lower vagina and rectum substantially increases the yield of detection compared with sampling the vagina alone. Penicillin G, ampicillin, or cefazolin are indicated for intrapartum prophylaxis of perinatal GBS colonization. Reflex susceptibility testing should be performed prior to use of clindamycin only on GBS isolates from penicillin-allergic women who are considered a high risk for anaphylaxis. Treatment with vancomycin without additional testing is warranted if resistance to clindamycin is noted.    Neisseria Gonorrhea 01/21/2022 Negative   Final   Chlamydia 01/21/2022 Negative   Final   Comment 01/21/2022 Normal Reference Ranger Chlamydia - Negative   Final   Comment 01/21/2022 Normal Reference Range Neisseria Gonorrhea - Negative   Final  Appointment on 11/18/2021  Component Date Value Ref Range Status   HIV Screen 4th Generation wRfx 11/18/2021 Non Reactive  Non Reactive Final   Comment: HIV Negative HIV-1/HIV-2 antibodies and HIV-1 p24 antigen were NOT detected. There is no laboratory evidence of HIV infection.    RPR Ser Ql 11/18/2021 Non Reactive  Non Reactive Final   WBC 11/18/2021 9.0  3.4 - 10.8 x10E3/uL Final   RBC 11/18/2021 3.94  3.77 - 5.28 x10E6/uL Final   Hemoglobin 11/18/2021 12.4  11.1 - 15.9 g/dL Final   Hematocrit 11/18/2021 36.3  34.0 - 46.6 % Final   MCV 11/18/2021 92  79 - 97 fL Final   MCH 11/18/2021 31.5  26.6 - 33.0 pg Final   MCHC 11/18/2021 34.2  31.5 - 35.7 g/dL Final   RDW 11/18/2021 12.8  11.7 - 15.4 % Final   Platelets 11/18/2021  222  150 - 450 x10E3/uL Final   Glucose, Fasting 11/18/2021 78  70 - 91 mg/dL Final   Glucose, 1 hour 11/18/2021 142  70 - 179 mg/dL Final   Glucose, 2 hour 11/18/2021 109  70 - 152 mg/dL Final  Routine Prenatal on 10/21/2021  Component Date Value Ref Range Status   WBC 10/21/2021 9.1  3.4 - 10.8 x10E3/uL Final   RBC 10/21/2021 4.16  3.77 - 5.28 x10E6/uL Final   Hemoglobin 10/21/2021 12.7  11.1 - 15.9 g/dL Final   Hematocrit 10/21/2021 36.6  34.0 - 46.6 % Final   MCV 10/21/2021 88  79 - 97 fL Final   MCH 10/21/2021 30.5  26.6 - 33.0 pg Final   MCHC 10/21/2021 34.7  31.5 - 35.7 g/dL Final   RDW 10/21/2021 13.6  11.7 - 15.4 % Final   Platelets 10/21/2021 193  150 - 450 x10E3/uL Final   Glucose 10/21/2021 96  70 - 99 mg/dL Final   BUN 10/21/2021 7  6 - 24 mg/dL Final   Creatinine, Ser 10/21/2021 0.76  0.57 - 1.00 mg/dL Final   eGFR 10/21/2021  101  >59 mL/min/1.73 Final   BUN/Creatinine Ratio 10/21/2021 9  9 - 23 Final   Sodium 10/21/2021 138  134 - 144 mmol/L Final   Potassium 10/21/2021 3.9  3.5 - 5.2 mmol/L Final   Chloride 10/21/2021 102  96 - 106 mmol/L Final   CO2 10/21/2021 20  20 - 29 mmol/L Final   Calcium 10/21/2021 9.5  8.7 - 10.2 mg/dL Final   Total Protein 10/21/2021 6.7  6.0 - 8.5 g/dL Final   Albumin 10/21/2021 4.3  3.9 - 4.9 g/dL Final   Globulin, Total 10/21/2021 2.4  1.5 - 4.5 g/dL Final   Albumin/Globulin Ratio 10/21/2021 1.8  1.2 - 2.2 Final   Bilirubin Total 10/21/2021 0.3  0.0 - 1.2 mg/dL Final   Alkaline Phosphatase 10/21/2021 66  44 - 121 IU/L Final   AST 10/21/2021 18  0 - 40 IU/L Final   ALT 10/21/2021 18  0 - 32 IU/L Final   Creatinine, Urine 10/21/2021 74.4  Not Estab. mg/dL Final   Protein, Ur 10/21/2021 10.7  Not Estab. mg/dL Final   Protein/Creat Ratio 10/21/2021 144  0 - 200 mg/g creat Final  Admission on 10/13/2021, Discharged on 10/13/2021  Component Date Value Ref Range Status   SARS Coronavirus 2 by RT PCR 10/13/2021 NEGATIVE  NEGATIVE Final    Comment: (NOTE) SARS-CoV-2 target nucleic acids are NOT DETECTED.  The SARS-CoV-2 RNA is generally detectable in upper and lower respiratory specimens during the acute phase of infection. The lowest concentration of SARS-CoV-2 viral copies this assay can detect is 250 copies / mL. A negative result does not preclude SARS-CoV-2 infection and should not be used as the sole basis for treatment or other patient management decisions.  A negative result may occur with improper specimen collection / handling, submission of specimen other than nasopharyngeal swab, presence of viral mutation(s) within the areas targeted by this assay, and inadequate number of viral copies (<250 copies / mL). A negative result must be combined with clinical observations, patient history, and epidemiological information.  Fact Sheet for Patients:   https://www.patel.info/  Fact Sheet for Healthcare Providers: https://hall.com/  This test is not yet approved or                           cleared by the [place] FDA and has been authorized for detection and/or diagnosis of SARS-CoV-2 by FDA under an Emergency Use Authorization (EUA).  This EUA will remain in effect (meaning this test can be used) for the duration of the COVID-19 declaration under Section 564(b)(1) of the Act, 21 U.S.C. section 360bbb-3(b)(1), unless the authorization is terminated or revoked sooner.  Performed at [facility], [place] [address], [place], [place] [address]   Routine Prenatal on 09/07/2021  Component Date Value Ref Range Status   Results 09/07/2021 Report   Final   Test Results: 09/07/2021 *Screen Negative*   Final   Gest. Age on Collection Date 09/07/2021 16.5  weeks Final   Gestat. Age Based On 09/07/2021 LMP   Final   Comment: Recalculations are not recommended when gestational dating by LMP and ultrasound are within 10 days.    Maternal Age At EDD 09/07/2021 41.8  yr  Final   Race 09/07/2021 Black   Final   Weight 09/07/2021 169  lbs Final   Insulin Dep Diabetes 09/07/2021 No   Final   Multiple Gestation 09/07/2021 No   Final   AFP Value 09/07/2021  48.8  ng/mL Final   AFP MoM 09/07/2021 1.30   Final   OSBR Risk 1 IN 09/07/2021 9,606   Final   Interpretation 09/07/2021 Comment   Final   Comment: Interpretation: Screen Negative This result is screen negative for fetal OSB. The AFP MoM calculated is based on the gestational age provided. MS-AFP can identify up to 80% of open fetal neural tube defects. Closed neural tube defects and some open defects may not be detected by this test. This test does not screen for fetal Down Syndrome or Trisomy 18. If screening for Down Syndrome or Trisomy 18 is desired, Print production planner to discuss available options.  The [employer] of Obstetricians and Gynecologists recommends amniocentesis be offered to women age 5 and older.    Comment: 09/07/2021 Comment   Final   Comment: Juanda Crumble, Ph.D., [facility] Director References: Available Upon Request. Multiples Of Median Cutoffs         For AFP Elevations Singleton   2.5           Black    2.8 IDD         2.0           Twins    4.5         Abbreviation Definitions IDD - Insulin Dep Diabetes OSBR - Open Spina Bifida Risk For further inquiries contact [place] at [phone]-GENE. This test was developed and its performance characteristics determined by [facility]. It has not been cleared or approved by the Food and Drug Administration.     Allergies: Patient has no known allergies.  Medications:  Facility Ordered Medications  Medication   acetaminophen (TYLENOL) tablet 650 mg   alum & mag hydroxide-simeth (MAALOX/MYLANTA) 200-200-20 MG/5ML suspension 30 mL   magnesium hydroxide (MILK OF MAGNESIA) suspension 30 mL   traZODone (DESYREL) tablet 50 mg   hydrOXYzine (ATARAX) tablet 25 mg   OLANZapine zydis (ZYPREXA)  disintegrating tablet 5 mg   PTA Medications  Medication Sig   NIFEdipine (PROCARDIA-XL/NIFEDICAL-XL) 30 MG 24 hr tablet Take 1 tablet (30 mg total) by mouth daily.   doxylamine, Sleep, (UNISOM) 25 MG tablet Take 25 mg by mouth at bedtime as needed for sleep.   ferrous sulfate 325 (65 FE) MG tablet Take 1 tablet (325 mg total) by mouth every other day. (Patient taking differently: Take 325 mg by mouth daily.)   hydrOXYzine (ATARAX) 50 MG tablet Take 1 tablet (50 mg total) by mouth 3 (three) times daily as needed for anxiety.   sertraline (ZOLOFT) 50 MG tablet Take 1 tablet (50 mg total) by mouth daily.   senna-docusate (SENOKOT-S) 8.6-50 MG tablet Take 2 tablets by mouth daily.   Ibuprofen-diphenhydrAMINE Cit (ADVIL PM) 200-38 MG TABS Take 2 tablets by mouth at bedtime as needed (For sleep). Do not take more than 2 tablets in 24 hours.    Medical Decision Making  Patient presents to [facility] due to not being able to sleep.  However assessment findings show that patient is experiencing severe depression.  She is having difficulty completing ADLs such as bathing and eating due to her depression.  She is having intrusive racing thoughts.  She appears to be experiencing postpartum depression.  She is recommended for inpatient psychiatric admission for stabilization.    Recommendations  Based on my evaluation the patient does not appear to have an emergency medical condition.  Patient meets criteria for inpatient psychiatric admission.  [facility] notified and there is  no bed availability.  Patient has been accepted to [facility] and will be transported via safe transport on 01/26/2022  IVC will remain in place.  Zyprexa 5 mg twice daily initiated  Lab Orders         Resp panel by RT-PCR (RSV, Flu A&B, Covid) Anterior Nasal Swab         CBC with Differential/Platelet         Comprehensive metabolic panel         Hemoglobin A1c         Magnesium         Ethanol         Lipid  panel         TSH         POC urine preg, ED         POCT Urine Drug Screen - (I-Screen)         POC SARS Coronavirus 2 Ag         Pregnancy, urine POC      EKG   Revonda Humphrey, NP 02/16/22  11:12 AM

## 2022-02-16 NOTE — Progress Notes (Signed)
   02/16/22 1034  [place] (Walk-ins at [facility] only)  How Did You Hear About [place]? Family/Friend  What Is the Reason for Your Visit/Call Today? Pt is a 42 yo female who presented voluntarily with multiple family menbers at their urging due to depression and not sleeping in the latter stages of her recent pregnancy and after the birth of her daughter 2 weeks ago. Pt is visually disabled from [place] and her sister and brrother-in-law have been living with pt to help her with pregnancy and birth. Pt gave verbal permission for her sister to be present and participate in the assessment. Per pt and sister, pt has not slept well in over a month and stated that she has only slept one 8 hour night since the baby was born. Pt stated that she cannot keep her mind from racing and is stated she has reggret that she got back together with her estranged husband and had the baby with him. Pt denied any thoughts of hurting her baby. Sister stated she has no concerns and has witnessed nothing to make her thing that the pt would hurt her baby. Pt denied current SI, HI, NSSH, AVH, paranoia and substance use. Pt stated that she has had ongoing regret about going back to her husband and having the baby with him. Pt stated that she has had thoughts of wanting to get out of the situation including not wanting to be alive. Per sister,on one occasion, pt discussed with family what colors she wanted used at her funeral. Pt stated that she "feels crazy" and she is "afraid they will tkae the kids away." Pt has no current OP psychiatric providers. Pt is employed at the [employer] and has worked there for 20 years. Pt is currently on maternity leave from her job.  How Long Has This Been Causing You Problems? 1-6 months  Have You Recently Had Any Thoughts About Hurting Yourself? No  How long ago did you have thoughts about hurting yourself? Pt stated that she has thoughts of not wanting to be here daily.  Are You  Planning to Commit Suicide/Harm Yourself At This time? No  Have you Recently Had Thoughts About [place]? No  Are You Planning To Harm Someone At This Time? No  Are you currently experiencing any auditory, visual or other hallucinations? No  Have You Used Any Alcohol or Drugs in the Past 24 Hours? No  Do you have any current medical co-morbidities that require immediate attention?  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 Pt did not appear to be responding to internal stimuli, experiencing delusional thinking or intoxicated. Pt's speech and movement appeared within normal limits and appearance was unremarkable. Pt's mood seemed anxious and depressed, and pt had a flat affect which was congruent. Pt's speech and movement seemed nervous and jittery. Pt's judgment and insight seemed impaired.  What Do You Feel Would Help You the Most Today? Treatment for Depression or other mood problem  Determination of Need Emergent (2 hours) (Per Thomes Lolling NP, pt is recommended fot Inpatient psychiatric treatment.)  Options For Referral Inpatient Hospitalization   Dyrell Tuccillo T. Mare Ferrari, [place], [place] [facility], [facility] Triage Specialist [facility]

## 2022-02-16 NOTE — ED Notes (Addendum)
Patient is  resting in no acute stress. RR even and unlabored .Environment secured .Will continue to monitor for safely. 

## 2022-02-16 NOTE — ED Notes (Signed)
Pt was accepted to [facility] 02/17/2022  Pt meets inpatient criteria per Thomes Lolling, NP  Attending Physician will be Elberta Leatherwood, MD  Report can be called to: [phone]  Pt can arrive after 8 AM  Care Team Notified: Thomes Lolling, NP, Lynnda Shields, RN, and Mardene Sayer, RN

## 2022-02-16 NOTE — ED Notes (Signed)
Pt asleep at this hour. No apparent distress. RR even and unlabored. Monitored for safety.

## 2022-02-16 NOTE — ED Notes (Signed)
Patient denies any s/s of hypertension.

## 2022-02-16 NOTE — Progress Notes (Signed)
LCSW Progress Note  [id number]   Yesenia Velasquez  02/16/2022  1:41 PM  Description:   Inpatient Psychiatric Referral  Patient was recommended inpatient per Thomes Lolling, NP. There are no available beds at [facility]. Patient was referred to the following facilities:   Destination  Service Provider Address Phone Fax  [facility]  [address], [place] [address] [phone]  [place], [place] [address] [phone]  [facility] [place]  [place], [place] [address] [phone] [address] [place] [address] [phone]  [facility]  [place], [place] [address] [phone]  [place], [place] [address] [phone]  [facility]  [address] [place], [place] [address] [phone] [place] [place] [address] [phone] [facility]  [address] [place] [address] [phone]  [facility]  [address], [place] [address] [phone] [place]  [address], [place] [address] [phone] [facility]  [place] [address] [phone] [facility]  [address] [place] [address] [phone]  [facility]  [address] [place] [address] [phone] [facility]  [address], [place] [address] [phone] [facility]  [address],  [place] [address] [phone] [facility]  [address], [place] [address] [phone] [facility]  [address] [place] [address] [phone]  [facility]  [address] [place] [address] [place]  [facility]  [address] [place] [address] [phone] [place]  [address] [place] [address] [phone]  [facility]  [address] [place] [address] [phone] [place]  [address] [place] [address] [phone]  [facility] [address] [place] [address] [phone]  [facility]  [address] [place] [place] [address] [place]  [facility]  [address] [place] [address] [phone]  [facility]  [address], [place] [address] [phone]      Situation ongoing, CSW to continue following and update chart as more information becomes available.      Denna Haggard, [place]  02/16/2022 1:41 PM

## 2022-02-16 NOTE — ED Notes (Signed)
STAT lab courier called to transport labs to [facility] lab 

## 2022-02-16 NOTE — Discharge Instructions (Signed)
Transfer to [facility] for inpatient psychiatric admission

## 2022-02-16 NOTE — BH Assessment (Signed)
Comprehensive Clinical Assessment (CCA) Note  02/16/2022 Yesenia Velasquez [id number]  DISPOSITION: Per Thomes Lolling NP, pt is recommended for Inpatient psychiatric treatment.   The patient demonstrates the following risk factors for suicide: Chronic risk factors for suicide include: psychiatric disorder of MDD, Single Episode, Moderate . Acute risk factors for suicide include: family or marital conflict and unemployment. Protective factors for this patient include: positive social support, responsibility to others (children, family), and hope for the future. Considering these factors, the overall suicide risk at this point appears to be moderate to high. Patient is appropriate for outpatient follow up.   Pt is a 42 yo female who presented voluntarily with multiple family members at their urging due to depression and not sleeping in the latter stages of her recent pregnancy and after the birth of her daughter 2 weeks ago. Pt is visually disabled from [place] and her sister and brother-in-law have been living with pt to help her with pregnancy and birth. Pt gave verbal permission for her sister to be present and participate in the assessment. Per pt and sister, pt has not slept well in over a month and stated that she has only slept one 8 hour night since the baby was born. Pt stated that she cannot keep her mind from racing and is stated she has regret that she got back together with her estranged husband and had the baby with him. Pt denied any thoughts of hurting her baby. Sister stated she has no concerns and has witnessed nothing to make her thing that the pt would hurt her baby. Pt denied current SI, HI, NSSH, AVH, paranoia and substance use. Pt stated that she has had ongoing regret about going back to her husband and having the baby with him. Pt stated that she has had thoughts of wanting to get out of the situation including not wanting to be alive. Per sister, on one occasion, pt discussed with  family what colors she wanted used at her funeral. Pt stated that she "feels crazy" and she is "afraid they will take the kids away." Pt has no current OP psychiatric providers. Pt is employed at the [employer] and has worked there for 20 years. Pt is currently on maternity leave from her job.  Sister, Preslynn Bier, was present and participated in the assessment with pt's verbal permission. 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 Pt is stated to be independent in familiar surroundings but would possibly need 1:1 assistance in an unfamiliar setting. Per sister, pt has been relatively independent for many years and this change in behavior and personality has "been out of the blue." Per sister, she has given pt OTC medications to assist her in sleeping.  None have worked to date and sister stated that pt "fights sleep." Pt stated he thoughts just "won't stop." Pt stated she has been continually thinking about her regrets of getting back together with her husband and having her baby. Pt has worked for [employer] for [employer] for 20 years. Pt denied any legal issues past or present. Pt denied any access to firearms and her sister confirmed. Pt denied any hx of abuse/neglect in her childhood. Pt stated she graduated from high school.   Pt stated in addition to not being able to sleep she has lost 30 lbs recently from loss of appetite.  Pt stated she has no desire to do the activities she once did such as line dancing or attend  her place of worship.   Pt was calm, cooperative, alert and appeared oriented. Pt did not appear to be responding to internal stimuli, experiencing delusional thinking or intoxicated. Pt's speech and movement appeared within normal limits and appearance was unremarkable. Pt's mood seemed anxious and depressed, and pt had a flat affect which was congruent. Pt's speech and movement seemed nervous and jittery. Pt's judgment and insight seemed  impaired         Chief Complaint:  Chief Complaint  Patient presents with   Suicidal   Visit Diagnosis:  MDD, Single Episode, Severe    CCA Screening, Triage and Referral (STR)  Patient Reported Information How did you hear about [place]? Family/Friend  What Is the Reason for Your Visit/Call Today? Pt is a 42 yo female who presented voluntarily with multiple family members at their urging due to depression and not sleeping in the latter stages of her recent pregnancy and after the birth of her daughter 2 weeks ago. Pt is visually disabled from [place] and her sister and brother-in-law have been living with pt to help her with pregnancy and birth. Pt gave verbal permission for her sister to be present and participate in the assessment. Per pt and sister, pt has not slept well in over a month and stated that she has only slept one 8 hour night since the baby was born. Pt stated that she cannot keep her mind from racing and is stated she has regret that she got back together with her estranged husband and had the baby with him. Pt denied any thoughts of hurting her baby. Sister stated she has no concerns and has witnessed nothing to make her thing that the pt would hurt her baby. Pt denied current SI, HI, NSSH, AVH, paranoia and substance use. Pt stated that she has had ongoing regret about going back to her husband and having the baby with him. Pt stated that she has had thoughts of wanting to get out of the situation including not wanting to be alive. Per sister, on one occasion, pt discussed with family what colors she wanted used at her funeral. Pt stated that she "feels crazy" and she is "afraid they will take the kids away." Pt has no current OP psychiatric providers. Pt is employed at the [employer] and has worked there for 20 years. Pt is currently on maternity leave from her job.  How Long Has This Been Causing You Problems? 1-6 months  What Do You Feel Would Help You the  Most Today? Treatment for Depression or other mood problem   Have You Recently Had Any Thoughts About Hurting Yourself? No  Are You Planning to Commit Suicide/Harm Yourself At This time? No   Flowsheet Row ED from 02/16/2022 in [facility]mission (Discharged) from 02/03/2022 in [facility] Mother Baby [facility] ED from 10/13/2021 in [facility] [place] High Risk No Risk No Risk       Have you Recently Had Thoughts About [place]? No  Are You Planning to Harm Someone at This Time? No  Explanation: No data recorded  Have You Used Any Alcohol or Drugs in the Past 24 Hours? No  What Did You Use and How Much? No data recorded  Do You Currently Have a Therapist/Psychiatrist? No  Name of Therapist/Psychiatrist: Name of Therapist/Psychiatrist: Pt has no current outpatient psychiatric providers. Pt stated she was prescribed psychiatric medications during her delivery hospital  stay.   Have You Been Recently Discharged From Any Office Practice or Programs? No  Explanation of Discharge From Practice/Program: na     CCA Screening Triage Referral Assessment Type of Contact: Face-to-Face  Telemedicine Service Delivery:   Is this Initial or Reassessment?   Date Telepsych consult ordered in [facility]:    Time Telepsych consult ordered in [facility]:    Location of Assessment: [facility] Assessment Services  Provider Location: [facility] Assessment Services   Collateral Involvement: Sister, Gianella Chismar, was present and participated in the assessment with pt's verbal permission.   Does Patient Have a Stage manager Guardian? No  Legal Guardian Contact Information: na  Copy of Legal Guardianship Form: No - copy requested (na)  Legal Guardian Notified of Arrival: -- (na)  Legal Guardian Notified of Pending Discharge: -- (na)  If Minor and Not Living with Parent(s), Who has Custody? No data recorded Is CPS involved  or ever been involved? Never (None reported)  Is APS involved or ever been involved? Never (None reported)   Patient Determined To Be At Risk for Harm To Self or Others Based on Review of Patient Reported Information or Presenting Complaint? Yes, for Self-Harm  Method: No Plan  Availability of Means: Has close by  Intent: Vague intent or NA  Notification Required: No need or identified person  Additional Information for Danger to Others Potential: -- (Pt has a newborn that she stated she regrets giving birth to and has been without regular uninterrupted sleep for over a month per pt and family.)  Additional Comments for Danger to Others Potential: na  Are There Guns or Other Weapons in Your Home? No (denied and sister agreed)  Types of Guns/Weapons: na  Are These Weapons Safely Secured?                            -- (na)  Who Could Verify You Are Able To Have These Secured: na  Do You Have any Outstanding Charges, Pending Court Dates, Parole/Probation? none reported- denied  Contacted To Inform of Risk of Harm To Self or Others: Other: Comment (na)    Does Patient Present under Involuntary Commitment? No    [place] of Residence: [place]   Patient Currently Receiving the Following Services: Not Receiving Services   Determination of Need: Emergent (2 hours) (Per Thomes Lolling NP, pt is recommended for Inpatient psychiatric treatment.)   Options For Referral: Inpatient Hospitalization     CCA Biopsychosocial Patient Reported Schizophrenia/Schizoaffective Diagnosis in Past: No   Strengths: resilient and typically independent despite disability challenges   Mental Health Symptoms Depression:   Change in energy/activity; Difficulty Concentrating; Fatigue; Hopelessness; Increase/decrease in appetite; Sleep (too much or little); Weight gain/loss   Duration of Depressive symptoms:  Duration of Depressive Symptoms: Greater than two weeks   Mania:   Racing  thoughts   Anxiety:    Difficulty concentrating; Fatigue; Restlessness; Sleep; Worrying   Psychosis:   None   Duration of Psychotic symptoms:    Trauma:   None   Obsessions:   None   Compulsions:   None   Inattention:   N/A   Hyperactivity/Impulsivity:   N/A   Oppositional/Defiant Behaviors:   N/A   Emotional Irregularity:   Mood lability; Potentially harmful impulsivity   Other Mood/Personality Symptoms:   none    Mental Status Exam Appearance and self-care  Stature:   Average   Weight:   Average weight  Clothing:   Casual; Neat/clean   Grooming:   Normal   Cosmetic use:   None   Posture/gait:   Tense   Motor activity:   Restless   Sensorium  Attention:   Distractible   Concentration:   Anxiety interferes; Scattered   Orientation:   Object; Person; Place; Situation; Time; X5   Recall/memory:   Normal   Affect and Mood  Affect:   Anxious; Depressed; Flat; Labile   Mood:   Anxious; Depressed; Dysphoric   Relating  Eye contact:   Fleeting   Facial expression:   Depressed; Anxious; Tense   Attitude toward examiner:   Cooperative; Defensive; Guarded; Suspicious   Thought and Language  Speech flow:  Clear and Coherent; Pressured; Paucity   Thought content:   Appropriate to Mood and Circumstances   Preoccupation:   Other (Comment) (Regrets over taking husband back after infeidelity and getting pregnant.)   Hallucinations:   None   Organization:   Intact   Transport planner of Knowledge:   Average   Intelligence:   Average   Abstraction:   Functional   Judgement:   Impaired   Reality Testing:   Adequate   Insight:   Flashes of insight; Lacking   Decision Making:   Confused   Social Functioning  Social Maturity:   Impulsive   Social Judgement:   Naive   Stress  Stressors:   Family conflict; Financial; Relationship; Work   Coping Ability:   Deficient supports; Overwhelmed;  Exhausted   Skill Deficits:   Activities of daily living (Per pt and sister, 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 Pt is stated to be independent in familiar surroundings but would possibly need 1:1 assistance in an unfamiliar setting.)   Supports:   Family; Friends/Service system; Support needed     Religion: Religion/Spirituality Are You A Religious Person?: Yes What is Your Religious Affiliation?: Christian How Might This Affect Treatment?: unknown  Leisure/Recreation: Leisure / Recreation Do You Have Hobbies?: No (none currently)  Exercise/Diet: Exercise/Diet Do You Exercise?: No Have You Gained or Lost A Significant Amount of Weight in the Past Six Months?: Yes-Lost Number of Pounds Lost?: 30 Do You Follow a Special Diet?: No Do You Have Any Trouble Sleeping?: Yes Explanation of Sleeping Difficulties: Pt has not had interrupted sleep in many days possibly weeks.   CCA Employment/Education Employment/Work Situation: Employment / Work Situation Employment Situation: Employed Work Stressors: being absent from work and worry about  losing her job Patient's Job has Been Impacted by Current Illness: No (Pr is currently on maternity leave.) Has Patient ever Been in the [employer]?: No  Education: Education Is Patient Currently Attending School?: No Last Grade Completed: 27 Did [place]?: No Did You Have An Individualized Education Program (IIEP): Yes Did You Have Any Difficulty At School?: Yes Were Any Medications Ever Prescribed For These Difficulties?: No Patient's Education Has Been Impacted by Current Illness: No   CCA Family/Childhood History Family and Relationship History: Family history Marital status: Separated Separated, when?: unknown What types of issues is patient dealing with in the relationship?: supposed infidelity Additional relationship information: na Does patient have children?: Yes How many children?: 2 (a 62 yo son and a 32  week old daughter) How is patient's relationship with their children?: "good"  Childhood History:  Childhood History By whom was/is the patient raised?: Both parents Did patient suffer any verbal/emotional/physical/sexual abuse as a child?: No Did patient suffer from severe childhood neglect?: No Has  patient ever been sexually abused/assaulted/raped as an adolescent or adult?: No Was the patient ever a victim of a crime or a disaster?: No Witnessed domestic violence?: No Has patient been affected by domestic violence as an adult?: No       CCA Substance Use Alcohol/Drug Use: Alcohol / Drug Use Pain Medications: see MAR Prescriptions: see MAR Over the Counter: see MAR History of alcohol / drug use?: No history of alcohol / drug abuse                         ASAM's:  Six Dimensions of Multidimensional Assessment  Dimension 1:  Acute Intoxication and/or Withdrawal Potential:      Dimension 2:  Biomedical Conditions and Complications:      Dimension 3:  Emotional, Behavioral, or Cognitive Conditions and Complications:     Dimension 4:  Readiness to Change:     Dimension 5:  Relapse, Continued use, or Continued Problem Potential:     Dimension 6:  Recovery/Living Environment:     ASAM Severity Score:    ASAM Recommended Level of Treatment:     Substance use Disorder (SUD)    Recommendations for Services/Supports/Treatments:    Discharge Disposition:    DSM5 Diagnoses: Patient Active Problem List   Diagnosis Date Noted   Anxiety and depression 02/05/2022   Status post bilateral salpingectomy 02/03/2022   Chronic hypertension affecting pregnancy 08/03/2021   Status post repeat low transverse cesarean section 08/03/2021   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/03/2021   Supervision of high risk pregnancy, antepartum 07/23/2021   AMA (advanced maternal age) multigravida 66+, third trimester 07/23/2021   Essential hypertension 04/06/2018   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 Mare Ferrari, [place], [facility], [facility] Triage Specialist [facility]

## 2022-02-17 DIAGNOSIS — F53 Postpartum depression: Secondary | ICD-10-CM | POA: Diagnosis not present

## 2022-02-17 NOTE — ED Provider Notes (Signed)
FBC/OBS ASAP Discharge Summary  Date and Time: 02/17/2022, 8:26 AM  Name: Yesenia Velasquez  Age: 42 y.o.  DOB: 1980-10-28  MRN:  [id number]   Discharge Diagnoses:  Principal Problem:   Post partum depression Active Problems:   Legally blind     HPI:  Per H&P: " HPI: patient presented to [facility] as a walk in  accompanied by her sister and other family members with complaints of "I have not slept".   Yesenia Velasquez, 42 y.o., female patient seen face to face by this provider, consulted with [name]; and chart reviewed on 02/16/22.  Per chart review patient has a past psychiatric history of anxiety and depression.  She is 2 weeks postpartum after giving birth by cesarean section.  Reports while she was in the hospital her provider prescribed Zoloft and hydroxyzine.  She denies any history of post partum depression with her first child who is now 49 years old.  She currently has no psychiatric services in place.  She denies any substance use.   Patient is legally blind.  Of note patient's urine pregnancy test is positive.  She denies that there could be any possibility of pregnancy.  She is postpartum by 2 weeks and hCG level is still elevated which contributes to positive pregnancy test.   [name] was present during the evaluation with patient's permission.  Yesenia Velasquez states that since giving birth patient is not eating and has lost weight, roughly 10-15 pounds.  They have to bring food to her and prompted her to eat.  She is not showering and taking care of her ADLs.  She was having difficulty sleeping before giving birth but since giving birth she has only slept roughly 8 hours.  Patient reports she has not slept at all in days.  Sister states all patient does is pace around the house throughout the night and during the day.  She told her sister yesterday what color of flowers she would like at her funeral.  Sister states that the whole family is extremely concerned because Yesenia Velasquez  has always been a strong independent woman.  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  States she is normally extremely motivated and this is a significant change from her baseline.  This change began to take place after patient separated from her husband a few months ago.  They had initially separated last year and got back together.  During the brief time they were together patient became pregnant.   During the assessment Yesenia Velasquez is sitting on the edge of the assessment chair.  She appears anxious.  She is casually dressed and makes no eye contact, she is legally blind.  She has clear speech at a normal rate and tone. She is endorsing racing thoughts.  She is slow to respond.  She ruminates throughout the assessment stating, "I regret getting back with him and regret having a baby".  She is hyperfocused on this thought.  She denies any thoughts of wanting to hurt her baby.  She admits to endorsing some suicidal ideations lately but was very withdrawn with questions and not forthcoming.  She never answered confidently that she was not suicidal today.  She does endorse thoughts of wanting to get out of her situation and not wanting to be alive.  She admits that she is depressed and has feelings of fatigue, hopelessness, decrease in appetite, constant worrying, and decreased sleep.  She has a flat affect.  Her insight and judgment are impaired.  She does not appear to be responding to internal/external stimuli.  She is easily distracted.   Discussed inpatient psychiatric treatment due to patient's severe depressive symptoms.  Patient initially agreed to inpatient treatment but changed her mind several times. Patient was placed under IVC by this Probation officer. Findings are as follows:    "Respondant presents almost 2 weeks post partum. Respondant has been uable to sleep for days and constantly paces. Respondant is rumindating on her past relationship and states she  regrets getting married and getting pregnant. Respindant has lost 10-15 pounds from not eating. Respondants family is having to prompt her to eat and shower. Respondant is neglecting her adls. Respondant is endorsing racing thoughts. Respondant has had fequent thoughts of the end of her life and has told family what color flowers she wants at her funernal. Respondant is extremely anxous and is refusing inpatient treatment.  Respondant is experiencing post partum depression and if untreated her condition could worsen".  "  Subjective:  Reported passive SI. Denied HI, AVH.  Mood: Anxious, depressed  Sleep: Poor Appetite: Poor  Review of Systems  Respiratory:  Negative for shortness of breath.   Cardiovascular:  Negative for chest pain.  Gastrointestinal:  Negative for nausea and vomiting.  Neurological:  Negative for dizziness and headaches.    Stay Summary:  Yesenia Velasquez, 42 y.o., female patient seen face to face by this provider, consulted with [name]; and chart reviewed on 02/16/22.  Per chart review patient has a past psychiatric history of anxiety and depression.  She is 2 weeks postpartum after giving birth by cesarean section. Total duration of encounter: 1 day   PTA Rx: Zoloft 50 mg daily  Behavioral concerns: No behavioral concerns, no agitation PRNs.   Zoloft was not started. Started zyprexa 5 mg BID   Tobacco Cessation:  N/A, patient does not currently use tobacco products    Medication List    START taking these medications    OLANZapine zydis 5 MG disintegrating tablet; Commonly known as: ZYPREXA;  Take 1 tablet (5 mg total) by mouth 2 (two) times daily.   CHANGE how you take these medications    FeroSul 325 (65 FE) MG tablet; Generic drug: ferrous sulfate; Take 1  tablet (325 mg total) by mouth every other day.; What changed: when to  take this   CONTINUE taking these medications    Advil PM 200-38 MG Tabs; Generic drug: Ibuprofen-diphenhydrAMINE Cit   hydrOXYzine 50 MG tablet; Commonly known as: ATARAX; Take 1 tablet (50  mg total) by mouth 3 (three) times daily as needed for anxiety.  NIFEdipine 30 MG 24 hr tablet; Commonly known as:  PROCARDIA-XL/NIFEDICAL-XL; Take 1 tablet (30 mg total) by mouth daily.  Senexon-S 8.6-50 MG tablet; Generic drug: senna-docusate; Take 2 tablets  by mouth daily.   STOP taking these medications    doxylamine (Sleep) 25 MG tablet; Commonly known as: UNISOM  sertraline 50 MG tablet; Commonly known as: Zoloft       All medication's risk, benefits, and side effects were discussed with patient prior to starting, dose changed or discontinuation.  Past Psychiatric History: Per H&P Past Medical History:  Past Medical History:  Diagnosis Date   Blindness    Hypertension     Past Surgical History:  Procedure Laterality Date   CESAREAN SECTION     CESAREAN SECTION WITH BILATERAL TUBAL LIGATION N/A 02/03/2022   Procedure: CESAREAN  SECTION WITH BILATERAL TUBAL LIGATION;  Surgeon: Gwynne Edinger, MD;  Location: [facility] LD ORS;  Service: Obstetrics;  Laterality: N/A;   left eye surgery     WISDOM TOOTH EXTRACTION     Family History:  Family History  Problem Relation Age of Onset   Hypertension Mother    Hypertension Father    Hypertension Sister    Hypertension Brother    Hypertension Maternal Aunt    Hypertension Maternal Uncle    Diabetes Maternal Uncle    Cancer Maternal Uncle    Hypertension Maternal Grandmother    Diabetes Maternal Grandmother    Heart disease Maternal Grandfather    Asthma Neg Hx    Family Psychiatric History: Per H&P Social History:  Social History   Substance and Sexual Activity  Alcohol Use Not Currently   Comment: SOCIALLY     Social History   Substance and Sexual Activity  Drug Use Not Currently    Social History   Socioeconomic History   Marital status: Single    Spouse name: Not on file   Number of children: Not on file   Years of education: Not on file    Highest education level: Not on file  Occupational History   Not on file  Tobacco Use   Smoking status: Never   Smokeless tobacco: Never  Vaping Use   Vaping Use: Never used  Substance and Sexual Activity   Alcohol use: Not Currently    Comment: SOCIALLY   Drug use: Not Currently   Sexual activity: Yes    Birth control/protection: None  Other Topics Concern   Not on file  Social History Narrative   Not on file   Social Determinants of Health   Financial Resource Strain: Low Risk  (04/16/2020)   Overall Financial Resource Strain (CARDIA)    Difficulty of Paying Living Expenses: Not hard at all  Food Insecurity: No Food Insecurity (04/16/2020)   Hunger Vital Sign    Worried About Running Out of Food in the Last Year: Never true    Ran Out of Food in the Last Year: Never true  Transportation Needs: No Transportation Needs (04/16/2020)   PRAPARE - Hydrologist (Medical): No    Lack of Transportation (Non-Medical): No  Physical Activity: Inactive (04/16/2020)   Exercise Vital Sign    Days of Exercise per Week: 0 days    Minutes of Exercise per Session: 0 min  Stress: No Stress Concern Present (04/16/2020)   [place]    Feeling of Stress : Not at all  Social Connections: Not on file   SDOH:  Nowthen: No Food Insecurity (04/16/2020)  Transportation Needs: No Transportation Needs (04/16/2020)  Depression (PHQ2-9): High Risk (11/18/2021)  Financial Resource Strain: Low Risk  (04/16/2020)  Physical Activity: Inactive (04/16/2020)  Stress: No Stress Concern Present (04/16/2020)  Tobacco Use: Low Risk  (02/04/2022)    Current Medications:  Current Facility-Administered Medications  Medication Dose Route Frequency Provider Last Rate Last Admin   acetaminophen (TYLENOL) tablet 650 mg  650 mg Oral Q6H PRN Revonda Humphrey, NP       alum & mag hydroxide-simeth  (MAALOX/MYLANTA) 200-200-20 MG/5ML suspension 30 mL  30 mL Oral Q4H PRN Revonda Humphrey, NP       hydrOXYzine (ATARAX) tablet 25 mg  25 mg Oral TID PRN Revonda Humphrey, NP   25 mg at 02/16/22  2105   OLANZapine zydis (ZYPREXA) disintegrating tablet 5 mg  5 mg Oral Q8H PRN Revonda Humphrey, NP       And   LORazepam (ATIVAN) tablet 1 mg  1 mg Oral PRN Revonda Humphrey, NP       And   ziprasidone (GEODON) injection 20 mg  20 mg Intramuscular PRN Revonda Humphrey, NP       magnesium hydroxide (MILK OF MAGNESIA) suspension 30 mL  30 mL Oral Daily PRN Revonda Humphrey, NP       OLANZapine zydis (ZYPREXA) disintegrating tablet 5 mg  5 mg Oral BID Revonda Humphrey, NP   5 mg at 02/16/22 2107   traZODone (DESYREL) tablet 50 mg  50 mg Oral QHS PRN Revonda Humphrey, NP   50 mg at 02/16/22 2106   Current Outpatient Medications  Medication Sig Dispense Refill   ferrous sulfate 325 (65 FE) MG tablet Take 1 tablet (325 mg total) by mouth every other day. (Patient taking differently: Take 325 mg by mouth daily.) 45 tablet 1   hydrOXYzine (ATARAX) 50 MG tablet Take 1 tablet (50 mg total) by mouth 3 (three) times daily as needed for anxiety. 45 tablet 1   Ibuprofen-diphenhydrAMINE Cit (ADVIL PM) 200-38 MG TABS Take 2 tablets by mouth at bedtime as needed (For sleep). Do not take more than 2 tablets in 24 hours.     NIFEdipine (PROCARDIA-XL/NIFEDICAL-XL) 30 MG 24 hr tablet Take 1 tablet (30 mg total) by mouth daily. 30 tablet 5   senna-docusate (SENOKOT-S) 8.6-50 MG tablet Take 2 tablets by mouth daily. 30 tablet 0   OLANZapine zydis (ZYPREXA) 5 MG disintegrating tablet Take 1 tablet (5 mg total) by mouth 2 (two) times daily.      PTA Medications: (Not in a hospital admission)     11/18/2021   11:15 AM 09/07/2021    4:47 PM 08/03/2021    9:06 AM  Depression screen PHQ 2/9  Decreased Interest 3 0 3  Down, Depressed, Hopeless 3 0 0  PHQ - 2 Score 6 0 3  Altered sleeping 3 0 3  Tired,  decreased energy 3 0 3  Change in appetite 3 0 2  Feeling bad or failure about yourself  3 0 3  Trouble concentrating 3 0 0  Moving slowly or fidgety/restless 2 0 0  Suicidal thoughts 2 0 0  PHQ-9 Score 25 0 14    Flowsheet Row ED from 02/16/2022 in [facility] Admission (Discharged) from 02/03/2022 in [place] [facility] ED from 10/13/2021 in [facility] [place] No Risk No Risk No Risk       Musculoskeletal  Strength & Muscle Tone: within normal limits Gait & Station: normal Patient leans: N/A   Psychiatric Specialty Exam   Presentation General Appearance:  Casual   Eye Contact: Poor (legally blind)   Speech: Clear and Coherent; Normal Rate   Speech Volume: Normal   Handedness: Right     Mood and Affect  Mood: Anxious; Depressed   Affect: Congruent     Thought Process  Thought Processes: Coherent   Descriptions of Associations:Intact   Orientation:Full (Time, Place and Person)   Thought Content:Rumination  Diagnosis of Schizophrenia or Schizoaffective disorder in past: No   Hallucinations:Hallucinations: None   Ideas of Reference:None   Suicidal Thoughts:Suicidal Thoughts: Passive SI   Homicidal Thoughts:Homicidal Thoughts: No     Sensorium  Memory:  Immediate Good; Recent Good; Remote Good   Judgment: Fair   Insight: Poor     Executive Functions  Concentration: Fair   Attention Span: Fair   Recall: Good   Fund of Knowledge: Good   Language: Good     Psychomotor Activity  Psychomotor Activity: Psychomotor Activity: Normal     Assets  Assets: Desire for Improvement; Communication Skills; Financial Resources/Insurance; Physical Health; Resilience; Social Support; Vocational/Educational     Sleep  Sleep: Sleep: Poor  Physical Exam  BP 119/79   Pulse 97   Temp 98.4 F (36.9 C)   Resp 18   LMP 05/13/2021 (Exact Date)   SpO2 100%    Physical Exam Vitals and nursing note reviewed.  Constitutional:      General: She is awake. She is not in acute distress.    Appearance: She is not ill-appearing or diaphoretic.  HENT:     Head: Normocephalic.  Pulmonary:     Effort: Pulmonary effort is normal. No respiratory distress.  Neurological:     Mental Status: She is alert.     Demographic Factors:  Low socioeconomic status  Loss Factors: Loss of significant relationship and Financial problems/change in socioeconomic status  Historical Factors: Impulsivity  Risk Reduction Factors:   Responsible for children under 48 years of age, Sense of responsibility to family, and Positive social support  Continued Clinical Symptoms:  Depression:   Anhedonia Delusional Hopelessness Impulsivity Insomnia Severe Previous Psychiatric Diagnoses and Treatments  Cognitive Features That Contribute To Risk:  Closed-mindedness, Loss of executive function, Polarized thinking, and Thought constriction (tunnel vision)    Suicide Risk:  Severe:  Frequent, intense, and enduring suicidal ideation, specific plan, no subjective intent, but some objective markers of intent (i.e., choice of lethal method), the method is accessible, some limited preparatory behavior, evidence of impaired self-control, severe dysphoria/symptomatology, multiple risk factors present, and few if any protective factors, particularly a lack of social support.  Plan Of Care/Follow-up recommendations:  Activity and diet at tolerated.  Please: Take all medications as prescribed by your mental healthcare provider. Report any adverse effects and or reactions from the medicines to your outpatient provider promptly. Do not engage in alcohol and or illegal drug use while on prescription medicines.  Disposition: Inpatient psych to [facility] for a higher level of care   Total Time spent with patient: 15 minutes  Signed: Merrily Brittle, DO Psychiatry Resident,  PGY-2 [facility] 02/17/2022, 8:26 AM

## 2022-02-17 NOTE — ED Notes (Signed)
Pt sleeping in recliner bed. Resp even and unlabored. Will continue to monitor for safety

## 2022-02-17 NOTE — ED Notes (Addendum)
Attempted to contact Sheriff's Department (x3) for out of county transport [phone]) for this patient to [facility]. Left voice message.

## 2022-02-17 NOTE — ED Notes (Signed)
Report given to Banker at [facility].  Verbalized understanding.  Pt was given back all her belongings including belongings brought in by her sister.  Pt ate breakfast and was escorted [place] sheriff dept was waiting for her.    No distress noted.  Pt left without incident.

## 2022-02-17 NOTE — ED Notes (Signed)
Pt resting quietly.  Breathing even and unlabored. No distress noted.   Sheriff Dept made aware of pending transfer.

## 2022-02-17 NOTE — ED Notes (Signed)
Pt asleep at this hour. No apparent distress. RR even and unlabored. Monitored for safety.

## 2022-02-18 ENCOUNTER — Encounter: Payer: Self-pay | Admitting: Family Medicine

## 2022-02-18 ENCOUNTER — Other Ambulatory Visit: Payer: Self-pay

## 2022-02-24 ENCOUNTER — Encounter: Payer: BC Managed Care – PPO | Admitting: Clinical

## 2022-03-17 ENCOUNTER — Ambulatory Visit: Payer: Self-pay | Admitting: Obstetrics and Gynecology

## 2023-05-16 ENCOUNTER — Ambulatory Visit: Payer: Self-pay | Admitting: Nurse Practitioner

## 2023-05-16 ENCOUNTER — Encounter: Payer: Self-pay | Admitting: Nurse Practitioner

## 2023-05-16 VITALS — BP 120/70 | HR 98 | Temp 98.9°F | Ht 63.0 in | Wt 155.0 lb

## 2023-05-16 DIAGNOSIS — Z6827 Body mass index (BMI) 27.0-27.9, adult: Secondary | ICD-10-CM

## 2023-05-16 DIAGNOSIS — Z7689 Persons encountering health services in other specified circumstances: Secondary | ICD-10-CM

## 2023-05-16 DIAGNOSIS — F419 Anxiety disorder, unspecified: Secondary | ICD-10-CM

## 2023-05-16 DIAGNOSIS — H548 Legal blindness, as defined in USA: Secondary | ICD-10-CM | POA: Diagnosis not present

## 2023-05-16 DIAGNOSIS — F32A Depression, unspecified: Secondary | ICD-10-CM | POA: Diagnosis not present

## 2023-05-16 DIAGNOSIS — E663 Overweight: Secondary | ICD-10-CM

## 2023-05-16 DIAGNOSIS — I10 Essential (primary) hypertension: Secondary | ICD-10-CM

## 2023-05-16 NOTE — Patient Instructions (Addendum)
 Goal bp less than 130/80. Check Blood pressure every other day and send readings once a week on mychart. We will determine if we need to restart your BP medications.

## 2023-05-16 NOTE — Progress Notes (Signed)
 [place], CMA,acting as a Neurosurgeon for Yesenia Epley, FNP.,have documented all relevant documentation on the behalf of Yesenia Epley, FNP,as directed by  Yesenia Epley, FNP while in the presence of Yesenia Epley, FNP.  Subjective:  Patient ID: Yesenia Velasquez , female    DOB: 1981/01/09 , 43 y.o.   MRN: [id number]  Chief Complaint  Patient presents with   Establish Care    HPI  Patient presents today to establish care. She was seeing Pete Brand, last visit was last year. She has had a baby. She is working at the [employer]. She did get married but in the middle of a divorce. Her oldest child (son) will be 18 soon. She did have some depression after having her baby.  Patient reports compliance with medication. Patient denies any chest pain, SOB, or headaches. Patient has no concerns today. Patient last saw her old PCP about a year ago. Patient reports she hasn't taking her blood pressure medicine since last year.   Her mother is helping her with her baby.      Past Medical History:  Diagnosis Date   Blindness    Hypertension      Family History  Problem Relation Age of Onset   Hypertension Mother    Hypertension Father    Hypertension Sister    Hypertension Brother    Hypertension Maternal Aunt    Hypertension Maternal Uncle    Diabetes Maternal Uncle    Cancer Maternal Uncle    Hypertension Maternal Grandmother    Diabetes Maternal Grandmother    Heart disease Maternal Grandfather    Asthma Neg Hx      Current Outpatient Medications:    ferrous sulfate  325 (65 FE) MG tablet, Take 1 tablet (325 mg total) by mouth every other day. (Patient not taking: Reported on 05/16/2023), Disp: 45 tablet, Rfl: 1   Ibuprofen -diphenhydrAMINE  Cit (ADVIL  PM) 200-38 MG TABS, Take 2 tablets by mouth at bedtime as needed (For sleep). Do not take more than 2 tablets in 24 hours. (Patient not taking: Reported on 05/16/2023), Disp: , Rfl:    OLANZapine  zydis (ZYPREXA ) 5 MG  disintegrating tablet, Take 1 tablet (5 mg total) by mouth 2 (two) times daily. (Patient not taking: Reported on 05/16/2023), Disp: , Rfl:    senna-docusate (SENOKOT-S) 8.6-50 MG tablet, Take 2 tablets by mouth daily. (Patient not taking: Reported on 05/16/2023), Disp: 30 tablet, Rfl: 0   No Known Allergies   Review of Systems  Constitutional: Negative.  Negative for activity change and fatigue.  Eyes:  Negative for visual disturbance.  Respiratory: Negative.  Negative for choking, shortness of breath and wheezing.   Cardiovascular: Negative.  Negative for chest pain, palpitations and leg swelling.  Gastrointestinal: Negative.   Endocrine: Negative.  Negative for polydipsia, polyphagia and polyuria.  Musculoskeletal: Negative.   Skin: Negative.   Neurological:  Negative for dizziness, weakness and headaches.  Psychiatric/Behavioral:  Negative for confusion. The patient is not nervous/anxious.      Today's Vitals   05/16/23 1458 05/16/23 1533  BP: (!) 120/90 120/70  Pulse: 98   Temp: 98.9 F (37.2 C)   TempSrc: Oral   Weight: 155 lb (70.3 kg)   Height: 5\' 3"  (1.6 m)   PainSc: 0-No pain    Body mass index is 27.46 kg/m.  Wt Readings from Last 3 Encounters:  05/16/23 155 lb (70.3 kg)  02/03/22 165 lb (74.8 kg)  01/27/22 163 lb (73.9 kg)  Objective:  Physical Exam Vitals and nursing note reviewed.  Constitutional:      General: She is not in acute distress.    Appearance: Normal appearance. She is well-developed.  HENT:     Head: Normocephalic and atraumatic.  Eyes:     Pupils: Pupils are equal, round, and reactive to light.  Cardiovascular:     Rate and Rhythm: Normal rate and regular rhythm.     Pulses: Normal pulses.     Heart sounds: Normal heart sounds. No murmur heard. Pulmonary:     Effort: Pulmonary effort is normal. No respiratory distress.     Breath sounds: Normal breath sounds. No wheezing.  Musculoskeletal:        General: Normal range of motion.   Skin:    General: Skin is warm and dry.     Capillary Refill: Capillary refill takes less than 2 seconds.  Neurological:     General: No focal deficit present.     Mental Status: She is alert and oriented to person, place, and time.     Cranial Nerves: No cranial nerve deficit.  Psychiatric:        Mood and Affect: Mood normal.         05/16/2023    2:57 PM 11/18/2021   11:15 AM 09/07/2021    4:47 PM 08/03/2021    9:06 AM 04/16/2020    3:06 PM  Depression screen PHQ 2/9  Decreased Interest 0 3 0 3 0  Down, Depressed, Hopeless 0 3 0 0 0  PHQ - 2 Score 0 6 0 3 0  Altered sleeping 0 3 0 3   Tired, decreased energy 0 3 0 3   Change in appetite 0 3 0 2   Feeling bad or failure about yourself  0 3 0 3   Trouble concentrating 0 3 0 0   Moving slowly or fidgety/restless 0 2 0 0   Suicidal thoughts 0 2 0 0   PHQ-9 Score 0 25 0 14   Difficult doing work/chores Not difficult at all        Assessment And Plan:  Establishing care with new doctor, encounter for Assessment & Plan: Patient is here to re-establish care. Went over patient medical, family, social and surgical history. Reviewed with patient their medications and any allergies  Reviewed with patient their sexual orientation, drug/tobacco and alcohol use Dicussed any new concerns with patient  recommended patient comes in for a physical exam and complete blood work.  Educated patient about the importance of annual screenings and immunizations.  Advised patient to eat a healthy diet along with exercise for atleast 30-45 min atleast 4-5 days of the week.     Essential hypertension Assessment & Plan: Blood pressure is controlled, no current medications she stopped after she had her baby   Orders: -     BMP8+eGFR  Legally blind Assessment & Plan: Her vision has worsened since having her baby due to stress.    Anxiety and depression Assessment & Plan: Depression screen score is 0. However her demeanor is flat and she  is tearful during visit. Will refer to counseling.   Orders: -     Ambulatory referral to Psychology  Overweight with body mass index (BMI) of 27 to 27.9 in adult    Return in about 6 months (around 11/15/2023) for phy when able: needs AWV.  Patient was given opportunity to ask questions. Patient verbalized understanding of the plan and was able to repeat key  elements of the plan. All questions were answered to their satisfaction.    Inge Mangle, FNP, have reviewed all documentation for this visit. The documentation on 05/16/23 for the exam, diagnosis, procedures, and orders are all accurate and complete.   IF YOU HAVE BEEN REFERRED TO A SPECIALIST, IT MAY TAKE 1-2 WEEKS TO SCHEDULE/PROCESS THE REFERRAL. IF YOU HAVE NOT HEARD FROM US /SPECIALIST IN TWO WEEKS, PLEASE GIVE US  A CALL AT [phone].

## 2023-05-17 ENCOUNTER — Encounter: Payer: Self-pay | Admitting: Nurse Practitioner

## 2023-05-17 LAB — BMP8+EGFR
BUN/Creatinine Ratio: 8 — ABNORMAL LOW (ref 9–23)
BUN: 7 mg/dL (ref 6–24)
CO2: 20 mmol/L (ref 20–29)
Calcium: 9.3 mg/dL (ref 8.7–10.2)
Chloride: 107 mmol/L — ABNORMAL HIGH (ref 96–106)
Creatinine, Ser: 0.84 mg/dL (ref 0.57–1.00)
Glucose: 85 mg/dL (ref 70–99)
Potassium: 4 mmol/L (ref 3.5–5.2)
Sodium: 142 mmol/L (ref 134–144)
eGFR: 88 mL/min/{1.73_m2} (ref >59)

## 2023-05-23 NOTE — Assessment & Plan Note (Signed)
 Patient is here to re-establish care. Went over patient medical, family, social and surgical history. Reviewed with patient their medications and any allergies  Reviewed with patient their sexual orientation, drug/tobacco and alcohol use Dicussed any new concerns with patient  recommended patient comes in for a physical exam and complete blood work.  Educated patient about the importance of annual screenings and immunizations.  Advised patient to eat a healthy diet along with exercise for atleast 30-45 min atleast 4-5 days of the week.

## 2023-05-23 NOTE — Assessment & Plan Note (Signed)
 Her vision has worsened since having her baby due to stress.

## 2023-05-23 NOTE — Assessment & Plan Note (Signed)
 Depression screen score is 0. However her demeanor is flat and she is tearful during visit. Will refer to counseling.

## 2023-05-23 NOTE — Assessment & Plan Note (Signed)
 Blood pressure is controlled, no current medications she stopped after she had her baby

## 2023-09-21 ENCOUNTER — Ambulatory Visit: Payer: Self-pay

## 2023-09-21 VITALS — BP 124/80 | HR 94 | Temp 98.7°F | Ht 63.0 in | Wt 165.2 lb

## 2023-09-21 DIAGNOSIS — F32A Depression, unspecified: Secondary | ICD-10-CM | POA: Diagnosis not present

## 2023-09-21 DIAGNOSIS — Z0000 Encounter for general adult medical examination without abnormal findings: Secondary | ICD-10-CM

## 2023-09-21 DIAGNOSIS — F419 Anxiety disorder, unspecified: Secondary | ICD-10-CM | POA: Diagnosis not present

## 2023-09-21 NOTE — Progress Notes (Signed)
 Subjective:   Yesenia Velasquez is a 43 y.o. who presents for a Medicare Wellness preventive visit.  As a reminder, Annual Wellness Visits don't include a physical exam, and some assessments may be limited, especially if this visit is performed virtually. We may recommend an in-person follow-up visit with your provider if needed.  Visit Complete: In person    Persons Participating in Visit: Patient.  AWV Questionnaire: No: Patient Medicare AWV questionnaire was not completed prior to this visit.  Cardiac Risk Factors include: advanced age (>56men, >26 women);hypertension     Objective:    Today's Vitals   09/21/23 1403  BP: 124/80  Pulse: 94  Temp: 98.7 F (37.1 C)  TempSrc: Oral  SpO2: 99%  Weight: 165 lb 3.2 oz (74.9 kg)  Height: 5' 3 (1.6 m)   Body mass index is 29.26 kg/m.     09/21/2023    2:10 PM 01/27/2022    2:16 PM 04/16/2020    3:06 PM 04/11/2019    2:42 PM 04/06/2018    3:44 PM  Advanced Directives  Does Patient Have a Medical Advance Directive? No No No No No   Would patient like information on creating a medical advance directive?  No - Patient declined   Yes (MAU/Ambulatory/Procedural Areas - Information given)      Data saved with a previous flowsheet row definition    Current Medications (verified) Outpatient Encounter Medications as of 09/21/2023  Medication Sig   ferrous sulfate  325 (65 FE) MG tablet Take 1 tablet (325 mg total) by mouth every other day. (Patient not taking: Reported on 09/21/2023)   Ibuprofen -diphenhydrAMINE  Cit (ADVIL  PM) 200-38 MG TABS Take 2 tablets by mouth at bedtime as needed (For sleep). Do not take more than 2 tablets in 24 hours. (Patient not taking: Reported on 09/21/2023)   OLANZapine  zydis (ZYPREXA ) 5 MG disintegrating tablet Take 1 tablet (5 mg total) by mouth 2 (two) times daily. (Patient not taking: Reported on 09/21/2023)   senna-docusate (SENOKOT-S) 8.6-50 MG tablet Take 2 tablets by mouth daily. (Patient not taking:  Reported on 09/21/2023)   No facility-administered encounter medications on file as of 09/21/2023.    Allergies (verified) Patient has no known allergies.   History: Past Medical History:  Diagnosis Date   Blindness    Hypertension    Past Surgical History:  Procedure Laterality Date   CESAREAN SECTION     CESAREAN SECTION WITH BILATERAL TUBAL LIGATION N/A 02/03/2022   Procedure: CESAREAN SECTION WITH BILATERAL TUBAL LIGATION;  Surgeon: Kandis Devaughn Sayres, MD;  Location: [facility];  Service: Obstetrics;  Laterality: N/A;   left eye surgery     WISDOM TOOTH EXTRACTION     Family History  Problem Relation Age of Onset   Hypertension Mother    Hypertension Father    Hypertension Sister    Hypertension Brother    Hypertension Maternal Aunt    Hypertension Maternal Uncle    Diabetes Maternal Uncle    Cancer Maternal Uncle    Hypertension Maternal Grandmother    Diabetes Maternal Grandmother    Heart disease Maternal Grandfather    Asthma Neg Hx    Social History   Socioeconomic History   Marital status: Single    Spouse name: Not on file   Number of children: Not on file   Years of education: Not on file   Highest education level: Not on file  Occupational History   Not on file  Tobacco Use  Smoking status: Never   Smokeless tobacco: Never  Vaping Use   Vaping status: Never Used  Substance and Sexual Activity   Alcohol use: Not Currently    Comment: SOCIALLY   Drug use: Not Currently   Sexual activity: Yes    Birth control/protection: None  Other Topics Concern   Not on file  Social History Narrative   Not on file   Social Drivers of Health   Financial Resource Strain: Low Risk  (09/21/2023)   Overall Financial Resource Strain (CARDIA)    Difficulty of Paying Living Expenses: Not hard at all  Food Insecurity: No Food Insecurity (09/21/2023)   Hunger Vital Sign    Worried About Running Out of Food in the Last Year: Never true    Ran Out of Food in the Last  Year: Never true  Transportation Needs: No Transportation Needs (09/21/2023)   PRAPARE - Administrator, Civil Service (Medical): No    Lack of Transportation (Non-Medical): No  Physical Activity: Inactive (09/21/2023)   Exercise Vital Sign    Days of Exercise per Week: 0 days    Minutes of Exercise per Session: 0 min  Stress: No Stress Concern Present (09/21/2023)   [employer] of Occupational Health - Occupational Stress Questionnaire    Feeling of Stress: Only a little  Social Connections: Moderately Isolated (09/21/2023)   Social Connection and Isolation Panel    Frequency of Communication with Friends and Family: More than three times a week    Frequency of Social Gatherings with Friends and Family: Once a week    Attends Religious Services: More than 4 times per year    Active Member of [employer] or Organizations: No    Attends Engineer, structural: Never    Marital Status: Divorced    Tobacco Counseling Counseling given: Not Answered    Clinical Intake:  Pre-visit preparation completed: Yes  Pain : No/denies pain     Nutritional Status: BMI 25 -29 Overweight Nutritional Risks: None Diabetes: No  Lab Results  Component Value Date   HGBA1C 4.8 02/16/2022   HGBA1C 5.3 08/03/2021     How often do you need to have someone help you when you read instructions, pamphlets, or other written materials from your doctor or pharmacy?: 1 - Never  Interpreter Needed?: No  Information entered by :: NAllen LPN   Activities of Daily Living     09/21/2023    2:05 PM  In your present state of health, do you have any difficulty performing the following activities:  Hearing? 0  Vision? 1  Comment legally blind  Difficulty concentrating or making decisions? 0  Walking or climbing stairs? 0  Dressing or bathing? 0  Doing errands, shopping? 1  Preparing Food and eating ? N  Using the Toilet? N  In the past six months, have you accidently leaked urine? N   Do you have problems with loss of bowel control? N  Managing your Medications? N  Managing your Finances? N  Housekeeping or managing your Housekeeping? N    Patient Care Team: Georgina Speaks, FNP as PCP - General (General Practice) Izell Harari, MD as PCP - OBGYN (Obstetrics and Gynecology)  I have updated your Care Teams any recent Medical Services you may have received from other providers in the past year.     Assessment:   This is a routine wellness examination for Yesenia Velasquez.  Hearing/Vision screen Hearing Screening - Comments:: Denies hearing issues Vision Screening - Comments::  No regular eye exams   Goals Addressed             This Visit's Progress    Patient Stated       09/21/2023, denies golas       Depression Screen     09/21/2023    2:12 PM 05/16/2023    2:57 PM 11/18/2021   11:15 AM 09/07/2021    4:47 PM 08/03/2021    9:06 AM 04/16/2020    3:06 PM 04/11/2019    2:43 PM  PHQ 2/9 Scores  PHQ - 2 Score 6 0 6 0 3 0 0  PHQ- 9 Score 6 0 25 0 14  0  Exception Documentation    Medical reason       Fall Risk     09/21/2023    2:12 PM 05/16/2023    2:56 PM 04/16/2020    3:06 PM 04/11/2019    2:42 PM 11/28/2018    2:27 PM  Fall Risk   Falls in the past year? 0 0 0 0  0   Number falls in past yr: 0 0     Injury with Fall? 0 0     Risk for fall due to : No Fall Risks No Fall Risks Impaired vision;Medication side effect    Follow up Falls evaluation completed;Falls prevention discussed Falls evaluation completed Falls evaluation completed;Education provided;Falls prevention discussed  Falls evaluation completed;Education provided;Falls prevention discussed       Data saved with a previous flowsheet row definition    MEDICARE RISK AT HOME:  Medicare Risk at Home Any stairs in or around the home?: Yes If so, are there any without handrails?: No Home free of loose throw rugs in walkways, pet beds, electrical cords, etc?: Yes Adequate lighting in your home to  reduce risk of falls?: Yes Life alert?: No Use of a cane, walker or w/c?: No Grab bars in the bathroom?: Yes Shower chair or bench in shower?: Yes Elevated toilet seat or a handicapped toilet?: No  TIMED UP AND GO:  Was the test performed?  Yes  Length of time to ambulate 10 feet: 6 sec Gait steady and fast without use of assistive device  Cognitive Function: 6CIT completed        09/21/2023    2:14 PM 04/16/2020    3:07 PM 04/11/2019    2:44 PM 04/06/2018    3:46 PM  6CIT Screen  What Year? 0 points 0 points 0 points 0 points  What month? 0 points 0 points 0 points 0 points  What time? 0 points 0 points 0 points 0 points  Count back from 20 0 points 0 points 0 points 0 points  Months in reverse 0 points 0 points 0 points 0 points  Repeat phrase 4 points 0 points 0 points 0 points  Total Score 4 points 0 points 0 points 0 points    Immunizations Immunization History  Administered Date(s) Administered   Influenza Whole 09/26/2015   Influenza,inj,Quad PF,6+ Mos 01/07/2022   Influenza-Unspecified 10/22/2016, 11/07/2017, 11/01/2018, 11/16/2019   PFIZER(Purple Top)SARS-COV-2 Vaccination 04/14/2019, 05/04/2019, 11/23/2019   Tdap 12/16/2021    Screening Tests Health Maintenance  Topic Date Due   Hepatitis B Vaccines 19-59 Average Risk (1 of 3 - 19+ 3-dose series) Never done   HPV VACCINES (1 - 3-dose SCDM series) Never done   Cervical Cancer Screening (HPV/Pap Cotest)  08/13/2023   INFLUENZA VACCINE  08/26/2023   Medicare Annual Wellness (AWV)  09/20/2024  DTaP/Tdap/Td (2 - Td or Tdap) 12/17/2031   Hepatitis C Screening  Completed   HIV Screening  Completed   Pneumococcal Vaccine  Aged Out   Meningococcal B Vaccine  Aged Out   COVID-19 Vaccine  Discontinued    Health Maintenance  Health Maintenance Due  Topic Date Due   Hepatitis B Vaccines 19-59 Average Risk (1 of 3 - 19+ 3-dose series) Never done   HPV VACCINES (1 - 3-dose SCDM series) Never done   Cervical  Cancer Screening (HPV/Pap Cotest)  08/13/2023   INFLUENZA VACCINE  08/26/2023   Health Maintenance Items Addressed: Due for flu vaccine.  Additional Screening:  Vision Screening: Recommended annual ophthalmology exams for early detection of glaucoma and other disorders of the eye. Would you like a referral to an eye doctor? No    Dental Screening: Recommended annual dental exams for proper oral hygiene  Community Resource Referral / Chronic Care Management: CRR required this visit?  Yes   CCM required this visit?  No   Plan:    I have personally reviewed and noted the following in the patient's chart:   Medical and social history Use of alcohol, tobacco or illicit drugs  Current medications and supplements including opioid prescriptions. Patient is not currently taking opioid prescriptions. Functional ability and status Nutritional status Physical activity Advanced directives List of other physicians Hospitalizations, surgeries, and ER visits in previous 12 months Vitals Screenings to include cognitive, depression, and falls Referrals and appointments  In addition, I have reviewed and discussed with patient certain preventive protocols, quality metrics, and best practice recommendations. A written personalized care plan for preventive services as well as general preventive health recommendations were provided to patient.   Yesenia FORBES Dawn, LPN   1/72/7974   After Visit Summary: (In Person-Declined) Patient declined AVS at this time.  Notes: Nothing significant to report at this time.

## 2023-09-21 NOTE — Patient Instructions (Signed)
 Yesenia Velasquez , Thank you for taking time out of your busy schedule to complete your Annual Wellness Visit with me. I enjoyed our conversation and look forward to speaking with you again next year. I, as well as your care team,  appreciate your ongoing commitment to your health goals. Please review the following plan we discussed and let me know if I can assist you in the future. Your Game plan/ To Do List    Referrals: If you haven't heard from the office you've been referred to, please reach out to them at the phone provided.   Follow up Visits: We will see or speak with you next year for your Next Medicare AWV with our clinical staff Have you seen your provider in the last 6 months (3 months if uncontrolled diabetes)? Yes  Clinician Recommendations:  Aim for 30 minutes of exercise or brisk walking, 6-8 glasses of water, and 5 servings of fruits and vegetables each day.       This is a list of the screenings recommended for you:  Health Maintenance  Topic Date Due   Hepatitis B Vaccine (1 of 3 - 19+ 3-dose series) Never done   HPV Vaccine (1 - 3-dose SCDM series) Never done   Pap with HPV screening  08/13/2023   Flu Shot  08/26/2023   Medicare Annual Wellness Visit  09/20/2024   DTaP/Tdap/Td vaccine (2 - Td or Tdap) 12/17/2031   Hepatitis C Screening  Completed   HIV Screening  Completed   Pneumococcal Vaccine  Aged Out   Meningitis B Vaccine  Aged Out   COVID-19 Vaccine  Discontinued    Advanced directives: (Provided) Advance directive discussed with you today. I have provided a copy for you to complete at home and have notarized. Once this is complete, please bring a copy in to our office so we can scan it into your chart.  Advance Care Planning is important because it:  [x]  Makes sure you receive the medical care that is consistent with your values, goals, and preferences  [x]  It provides guidance to your family and loved ones and reduces their decisional burden about whether or  not they are making the right decisions based on your wishes.  Follow the link provided in your after visit summary or read over the paperwork we have mailed to you to help you started getting your Advance Directives in place. If you need assistance in completing these, please reach out to us  so that we can help you!  See attachments for Preventive Care and Fall Prevention Tips.

## 2023-09-23 ENCOUNTER — Telehealth: Payer: Self-pay

## 2023-09-23 NOTE — Progress Notes (Signed)
 Complex Care Management Note  Care Guide Note 09/23/2023 Name: Yesenia Velasquez MRN: [id number] DOB: 1980/12/04  Yesenia Velasquez is a 43 y.o. year old female who sees Georgina Speaks, FNP for primary care. I reached out to Yesenia Velasquez by phone today to offer complex care management services.  [name] was given information about Complex Care Management services today including:   The Complex Care Management services include support from the care team which includes your Nurse Care Manager, Clinical Social Worker, or Pharmacist.  The Complex Care Management team is here to help remove barriers to the health concerns and goals most important to you. Complex Care Management services are voluntary, and the patient may decline or stop services at any time by request to their care team member.   Complex Care Management Consent Status: Patient agreed to services and verbal consent obtained.   Follow up plan:  10/25/23 @ 3 PM  Encounter Outcome:  Patient Scheduled  Leotis Rase [facility] Guide  Direct Dial: [phone]  Fax [phone]

## 2023-10-25 ENCOUNTER — Other Ambulatory Visit: Payer: Self-pay | Admitting: Licensed Clinical Social Worker

## 2023-10-25 NOTE — Patient Instructions (Signed)
 Visit Information  Thank you for taking time to visit with me today. Please don't hesitate to contact me if I can be of assistance to you before our next scheduled appointment.  Our next appointment is by telephone on 10/30 at 3 PM Please call the care guide team at [phone] if you need to cancel or reschedule your appointment.   Following is a copy of your care plan:   Goals Addressed             This Visit's Progress    LCSW VBCI Social Work Care Plan   On track    Problems:   Disease Management support and education needs related to Anxiety and Depression  CSW Clinical Goal(s):   Over the next 90 days the Patient will attend all scheduled medical appointments as evidenced by patient report and care team review of appointment completion in electronic MEDICAL RECORD NUMBER  demonstrate a reduction in symptoms related to Anxiety and Depression .  Interventions:  Mental Health:  Evaluation of current treatment plan related to Anxiety and Depression Active listening / Reflection utilized Behavioral Activation reviewed Financial risk analyst / information provided Emotional Support Provided Mindfulness or Relaxation training provided Participation in counseling encourage : Patient is open to local resources Participation in support group encouraged : Patient is open to local resources Provided general psycho-education for mental health needs Quality of sleep assessed & Sleep Hygiene techniques promoted Solution-Focued Strategies employed:LCSW discussed strategies to assist with self-care LCSW completed GAD7  Patient Goals/Self-Care Activities:  Increase coping skills, healthy habits, self-management skills, and stress reduction  Plan:   Telephone follow up appointment with care management team member scheduled for:  4 weeks        Please call the Suicide and Crisis Lifeline: 988 go to [facility] [address], [place]  [phone]) call 911 if you are experiencing a Mental Health or Behavioral Health Crisis or need someone to talk to.  Patient verbalizes understanding of instructions and care plan provided today and agrees to view in MyChart. Active MyChart status and patient understanding of how to access instructions and care plan via MyChart confirmed with patient.     Rolin Ezzard HUGHS [facility]  [facility], [facility] Clinical Social Worker Direct Dial: [phone]  Fax: [phone] Website: [email or web address] 5:36 PM

## 2023-11-08 ENCOUNTER — Encounter: Admitting: Nurse Practitioner

## 2023-11-24 ENCOUNTER — Telehealth: Payer: Self-pay | Admitting: Licensed Clinical Social Worker

## 2023-11-24 ENCOUNTER — Encounter: Payer: Self-pay | Admitting: Licensed Clinical Social Worker

## 2023-12-02 NOTE — Patient Instructions (Signed)
 Yesenia Velasquez - I am sorry I was unable to reach you today for our scheduled appointment. I work with Georgina Speaks, FNP and am calling to support your healthcare needs. Please contact me at [phone] at your earliest convenience. I look forward to speaking with you soon.   Thank you,  Rolin Kerns, LCSW [place]  [facility], [facility] Clinical Social Worker Direct Dial: [phone]  Fax: [phone] Website: [email or web address] 9:33 AM

## 2024-01-05 ENCOUNTER — Telehealth: Payer: Self-pay | Admitting: Licensed Clinical Social Worker

## 2024-01-06 ENCOUNTER — Telehealth: Payer: Self-pay | Admitting: Licensed Clinical Social Worker

## 2024-01-06 ENCOUNTER — Encounter: Payer: Self-pay | Admitting: Licensed Clinical Social Worker

## 2024-01-09 NOTE — Patient Instructions (Signed)
 Yesenia Velasquez - I am sorry I was unable to reach you today for our scheduled appointment. I work with Georgina Speaks, FNP and am calling to support your healthcare needs. Please contact me at [phone] at your earliest convenience. I look forward to speaking with you soon.   Thank you,  Rolin Kerns, LCSW   [facility], [facility] Clinical Social Worker Direct Dial: [phone]  Fax: [phone] Website: [email or web address] 12:52 PM

## 2024-01-30 ENCOUNTER — Other Ambulatory Visit: Payer: Self-pay | Admitting: Licensed Clinical Social Worker

## 2024-01-30 NOTE — Patient Instructions (Signed)
 Visit Information  Thank you for taking time to visit with me today. Please don't hesitate to contact me if I can be of assistance to you before our next scheduled appointment.   Closing From: Complex Care Management. Patient has met all care management goals. Care Management case will be closed. Patient has been provided contact information should new needs arise.   Please call the care guide team at [phone] if you need to cancel, schedule, or reschedule an appointment.   Please call the Suicide and Crisis Lifeline: 988 go to [facility]gent [facility] [address], [place] [phone]) call 911 if you are experiencing a Mental Health or Behavioral Health Crisis or need someone to talk to.  Rolin Kerns, LCSW [place]  [facility], [facility] Clinical Social Worker Direct Dial: [phone]  Fax: [phone] Website: [email or web address] 10:58 AM

## 2024-01-30 NOTE — Patient Outreach (Signed)
 Complex Care Management   Visit Note  01/30/2024  Name:  Yesenia Velasquez MRN: [id number] DOB: Jul 25, 1980  Situation: Referral received for Complex Care Management related to Mental/Behavioral Health diagnosis Anxiety and Depression I obtained verbal consent from Patient.  Visit completed with Patient  on the phone  Background:   Past Medical History:  Diagnosis Date   Blindness    Hypertension     Assessment: Patient Reported Symptoms:  Cognitive Cognitive Status: No symptoms reported, Alert and oriented to person, place, and time, Normal speech and language skills Cognitive/Intellectual Conditions Management [RPT]: None reported or documented in medical history or problem list      Neurological Neurological Review of Symptoms: Not assessed    HEENT HEENT Symptoms Reported: Not assessed      Cardiovascular Cardiovascular Symptoms Reported: Not assessed    Respiratory Respiratory Symptoms Reported: Not assesed    Endocrine Endocrine Symptoms Reported: Not assessed    Gastrointestinal Gastrointestinal Symptoms Reported: Not assessed      Genitourinary Genitourinary Symptoms Reported: Not assessed    Integumentary Integumentary Symptoms Reported: Not assessed    Musculoskeletal Musculoskelatal Symptoms Reviewed: Not assessed        Psychosocial Psychosocial Symptoms Reported: No symptoms reported Behavioral Management Strategies: Coping strategies Behavioral Health Self-Management Outcome: 4 (good) Major Change/Loss/Stressor/Fears (CP): Denies      01/30/2024    PHQ2-9 Depression Screening   Little interest or pleasure in doing things    Feeling down, depressed, or hopeless    PHQ-2 - Total Score    Trouble falling or staying asleep, or sleeping too much    Feeling tired or having little energy    Poor appetite or overeating     Feeling bad about yourself - or that you are a failure or have let yourself or your family down    Trouble concentrating on things, such  as reading the newspaper or watching television    Moving or speaking so slowly that other people could have noticed.  Or the opposite - being so fidgety or restless that you have been moving around a lot more than usual    Thoughts that you would be better off dead, or hurting yourself in some way    PHQ2-9 Total Score    If you checked off any problems, how difficult have these problems made it for you to do your work, take care of things at home, or get along with other people    Depression Interventions/Treatment      There were no vitals filed for this visit.    Medications Reviewed Today     Reviewed by Ezzard Rolin BIRCH, LCSW (Social Worker) on 01/30/24 at 1052  Med List Status: <None>   Medication Order Taking? Sig Documenting Provider Last Dose Status Informant  ferrous sulfate  325 (65 FE) MG tablet [id number]  Take 1 tablet (325 mg total) by mouth every other day.  Patient not taking: Reported on 10/25/2023   Synthia Raisin, CNM  Active Mother  Ibuprofen -diphenhydrAMINE  Cit (ADVIL  PM) 200-38 MG TABS [id number]  Take 2 tablets by mouth at bedtime as needed (For sleep). Do not take more than 2 tablets in 24 hours.  Patient not taking: Reported on 10/25/2023   [provider]  Active Mother  OLANZapine  zydis (ZYPREXA ) 5 MG disintegrating tablet [id number]  Take 1 tablet (5 mg total) by mouth 2 (two) times daily.  Patient not taking: Reported on 10/25/2023   Mardy Elveria DEL, NP  Active   senna-docusate (  SENOKOT-S) 8.6-50 MG tablet [id number]  Take 2 tablets by mouth daily.  Patient not taking: Reported on 10/25/2023   Synthia Raisin, CNM  Active Mother            Recommendation:   PCP Follow-up  Follow Up Plan:   Patient has met all care management goals. Care Management case will be closed. Patient has been provided contact information should new needs arise.   Rolin Ezzard HUGHS [facility]  [facility], [facility] Clinical Social  Worker Direct Dial: [phone]  Fax: [phone] Website: [email or web address] 10:57 AM

## 2024-02-22 ENCOUNTER — Encounter: Payer: Self-pay | Admitting: Nurse Practitioner

## 2024-02-23 ENCOUNTER — Ambulatory Visit: Admitting: Nurse Practitioner

## 2024-02-23 ENCOUNTER — Other Ambulatory Visit (HOSPITAL_COMMUNITY)
Admission: RE | Admit: 2024-02-23 | Discharge: 2024-02-23 | Disposition: A | Source: Ambulatory Visit | Attending: Nurse Practitioner | Admitting: Nurse Practitioner

## 2024-02-23 ENCOUNTER — Encounter: Payer: Self-pay | Admitting: Nurse Practitioner

## 2024-02-23 VITALS — BP 130/80 | HR 81 | Temp 98.5°F | Ht 63.0 in | Wt 168.0 lb

## 2024-02-23 DIAGNOSIS — I10 Essential (primary) hypertension: Secondary | ICD-10-CM

## 2024-02-23 DIAGNOSIS — Z113 Encounter for screening for infections with a predominantly sexual mode of transmission: Secondary | ICD-10-CM | POA: Diagnosis not present

## 2024-02-23 DIAGNOSIS — Z0000 Encounter for general adult medical examination without abnormal findings: Secondary | ICD-10-CM | POA: Diagnosis not present

## 2024-02-23 DIAGNOSIS — H548 Legal blindness, as defined in USA: Secondary | ICD-10-CM

## 2024-02-23 DIAGNOSIS — Z79899 Other long term (current) drug therapy: Secondary | ICD-10-CM | POA: Diagnosis not present

## 2024-02-23 DIAGNOSIS — F32A Depression, unspecified: Secondary | ICD-10-CM

## 2024-02-23 DIAGNOSIS — Z1231 Encounter for screening mammogram for malignant neoplasm of breast: Secondary | ICD-10-CM

## 2024-02-23 DIAGNOSIS — Z124 Encounter for screening for malignant neoplasm of cervix: Secondary | ICD-10-CM | POA: Insufficient documentation

## 2024-02-23 DIAGNOSIS — F419 Anxiety disorder, unspecified: Secondary | ICD-10-CM | POA: Diagnosis not present

## 2024-02-23 DIAGNOSIS — E7800 Pure hypercholesterolemia, unspecified: Secondary | ICD-10-CM

## 2024-02-23 LAB — POCT URINALYSIS DIP (CLINITEK)
Bilirubin, UA: NEGATIVE
Blood, UA: NEGATIVE
Glucose, UA: NEGATIVE mg/dL
Ketones, POC UA: NEGATIVE mg/dL
Leukocytes, UA: NEGATIVE
Nitrite, UA: NEGATIVE
POC PROTEIN,UA: NEGATIVE
Spec Grav, UA: 1.02
Urobilinogen, UA: 0.2 U/dL
pH, UA: 6.5

## 2024-02-24 ENCOUNTER — Other Ambulatory Visit

## 2024-02-24 DIAGNOSIS — Z0000 Encounter for general adult medical examination without abnormal findings: Secondary | ICD-10-CM | POA: Insufficient documentation

## 2024-02-24 LAB — MICROALBUMIN / CREATININE URINE RATIO
Creatinine, Urine: 129.3 mg/dL
Microalb/Creat Ratio: 11 mg/g{creat} (ref 0–29)
Microalbumin, Urine: 14.7 ug/mL

## 2024-02-24 NOTE — Assessment & Plan Note (Signed)
 This has resolved her depression and anxiety score are both 0

## 2024-02-24 NOTE — Assessment & Plan Note (Signed)
 Previous high cholesterol levels. Recent dietary intake may affect current cholesterol levels. No current medication. - Schedule fasting blood work to reassess cholesterol levels. - Advised fasting before blood work for accurate results.

## 2024-02-24 NOTE — Assessment & Plan Note (Signed)
 Blood pressure is controlled, no current medications

## 2024-02-24 NOTE — Assessment & Plan Note (Signed)
 Discussed menstrual cycle changes likely due to hormonal fluctuations post-pregnancy. No recent mammogram or breast self-exams. No current STD testing. - Schedule mammogram at breast center. - Encouraged regular breast self-exams. - Discussed importance of regular exercise and healthy diet. - Offered STD testing, including HIV and syphilis.

## 2024-02-25 LAB — CBC WITH DIFFERENTIAL/PLATELET
Basophils Absolute: 0.1 10*3/uL (ref 0.0–0.2)
Basos: 2 %
EOS (ABSOLUTE): 0.1 10*3/uL (ref 0.0–0.4)
Eos: 2 %
Hematocrit: 26.7 % — ABNORMAL LOW (ref 34.0–46.6)
Hemoglobin: 7.6 g/dL — ABNORMAL LOW (ref 11.1–15.9)
Immature Grans (Abs): 0 10*3/uL (ref 0.0–0.1)
Immature Granulocytes: 0 %
Lymphocytes Absolute: 1.4 10*3/uL (ref 0.7–3.1)
Lymphs: 37 %
MCH: 20.7 pg — ABNORMAL LOW (ref 26.6–33.0)
MCHC: 28.5 g/dL — ABNORMAL LOW (ref 31.5–35.7)
MCV: 73 fL — ABNORMAL LOW (ref 79–97)
Monocytes Absolute: 0.3 10*3/uL (ref 0.1–0.9)
Monocytes: 7 %
Neutrophils Absolute: 2 10*3/uL (ref 1.4–7.0)
Neutrophils: 52 %
Platelets: 328 10*3/uL (ref 150–450)
RBC: 3.68 x10E6/uL — ABNORMAL LOW (ref 3.77–5.28)
RDW: 15.9 % — ABNORMAL HIGH (ref 11.7–15.4)
WBC: 3.9 10*3/uL (ref 3.4–10.8)

## 2024-02-25 LAB — CMP14+EGFR
ALT: 12 [IU]/L (ref 0–32)
AST: 18 [IU]/L (ref 0–40)
Albumin: 4.3 g/dL (ref 3.9–4.9)
Alkaline Phosphatase: 80 [IU]/L (ref 41–116)
BUN/Creatinine Ratio: 8 — ABNORMAL LOW (ref 9–23)
BUN: 8 mg/dL (ref 6–24)
Bilirubin Total: 0.4 mg/dL (ref 0.0–1.2)
CO2: 20 mmol/L (ref 20–29)
Calcium: 9.1 mg/dL (ref 8.7–10.2)
Chloride: 106 mmol/L (ref 96–106)
Creatinine, Ser: 1.01 mg/dL — ABNORMAL HIGH (ref 0.57–1.00)
Globulin, Total: 2.6 g/dL (ref 1.5–4.5)
Glucose: 83 mg/dL (ref 70–99)
Potassium: 4.1 mmol/L (ref 3.5–5.2)
Sodium: 140 mmol/L (ref 134–144)
Total Protein: 6.9 g/dL (ref 6.0–8.5)
eGFR: 71 mL/min/{1.73_m2}

## 2024-02-25 LAB — LIPID PANEL
Chol/HDL Ratio: 2.6 ratio (ref 0.0–4.4)
Cholesterol, Total: 164 mg/dL (ref 100–199)
HDL: 64 mg/dL
LDL Chol Calc (NIH): 91 mg/dL (ref 0–99)
Triglycerides: 44 mg/dL (ref 0–149)
VLDL Cholesterol Cal: 9 mg/dL (ref 5–40)

## 2024-02-25 LAB — NUSWAB VAGINITIS PLUS (VG+)
Atopobium vaginae: HIGH {score} — AB
Candida albicans, NAA: NEGATIVE
Candida glabrata, NAA: NEGATIVE
Chlamydia trachomatis, NAA: NEGATIVE
Megasphaera 1: HIGH {score} — AB
Neisseria gonorrhoeae, NAA: NEGATIVE
Trich vag by NAA: NEGATIVE

## 2024-02-25 LAB — SYPHILIS: RPR W/REFLEX TO RPR TITER AND TREPONEMAL ANTIBODIES, TRADITIONAL SCREENING AND DIAGNOSIS ALGORITHM: RPR Ser Ql: NONREACTIVE

## 2024-02-25 LAB — HEPATITIS B SURFACE ANTIBODY,QUALITATIVE: Hep B Surface Ab, Qual: NONREACTIVE

## 2024-02-25 LAB — HIV ANTIBODY (ROUTINE TESTING W REFLEX): HIV Screen 4th Generation wRfx: NONREACTIVE

## 2024-02-27 ENCOUNTER — Ambulatory Visit: Payer: Self-pay | Admitting: Nurse Practitioner

## 2024-02-27 DIAGNOSIS — B9689 Other specified bacterial agents as the cause of diseases classified elsewhere: Secondary | ICD-10-CM

## 2024-02-27 LAB — CYTOLOGY - PAP
Adequacy: ABSENT
Diagnosis: NEGATIVE

## 2024-02-27 MED ORDER — METRONIDAZOLE 500 MG PO TABS: 500.0000 mg | ORAL_TABLET | Freq: Two times a day (BID) | ORAL | 0 refills | Status: AC

## 2024-03-01 ENCOUNTER — Other Ambulatory Visit: Payer: Self-pay

## 2024-03-01 LAB — FERRITIN: Ferritin: 6 ng/mL — ABNORMAL LOW (ref 15–150)

## 2024-03-01 LAB — SPECIMEN STATUS REPORT

## 2024-03-01 LAB — IRON AND TIBC
Iron Saturation: 4 % — CL (ref 15–55)
Iron: 17 ug/dL — ABNORMAL LOW (ref 27–159)
Total Iron Binding Capacity: 444 ug/dL (ref 250–450)
UIBC: 427 ug/dL — ABNORMAL HIGH (ref 131–425)

## 2024-08-22 ENCOUNTER — Ambulatory Visit: Payer: Self-pay | Admitting: Nurse Practitioner

## 2024-10-17 ENCOUNTER — Ambulatory Visit: Payer: Self-pay

## 2025-02-26 ENCOUNTER — Encounter: Payer: Self-pay | Admitting: Nurse Practitioner
